# Patient Record
Sex: Male | Born: 1935 | Race: Black or African American | Hispanic: No | Smoking: Former smoker
Health system: Southern US, Community
[De-identification: ages and names within clinical notes are randomized; demographics above are authoritative.]

## PROBLEM LIST (undated history)

## (undated) DIAGNOSIS — K219 Gastro-esophageal reflux disease without esophagitis: Secondary | ICD-10-CM

## (undated) DIAGNOSIS — J189 Pneumonia, unspecified organism: Secondary | ICD-10-CM

## (undated) DIAGNOSIS — C801 Malignant (primary) neoplasm, unspecified: Secondary | ICD-10-CM

## (undated) DIAGNOSIS — E119 Type 2 diabetes mellitus without complications: Secondary | ICD-10-CM

## (undated) DIAGNOSIS — M1990 Unspecified osteoarthritis, unspecified site: Secondary | ICD-10-CM

## (undated) DIAGNOSIS — I8390 Asymptomatic varicose veins of unspecified lower extremity: Secondary | ICD-10-CM

## (undated) DIAGNOSIS — D649 Anemia, unspecified: Secondary | ICD-10-CM

## (undated) DIAGNOSIS — I10 Essential (primary) hypertension: Secondary | ICD-10-CM

## (undated) DIAGNOSIS — E785 Hyperlipidemia, unspecified: Secondary | ICD-10-CM

## (undated) DIAGNOSIS — N289 Disorder of kidney and ureter, unspecified: Secondary | ICD-10-CM

## (undated) DIAGNOSIS — J449 Chronic obstructive pulmonary disease, unspecified: Secondary | ICD-10-CM

## (undated) DIAGNOSIS — Z22322 Carrier or suspected carrier of Methicillin resistant Staphylococcus aureus: Secondary | ICD-10-CM

## (undated) DIAGNOSIS — I739 Peripheral vascular disease, unspecified: Secondary | ICD-10-CM

## (undated) HISTORY — PX: BACK SURGERY: SHX140

## (undated) HISTORY — PX: JOINT REPLACEMENT: SHX530

## (undated) HISTORY — PX: SPINE SURGERY: SHX786

## (undated) HISTORY — DX: Asymptomatic varicose veins of unspecified lower extremity: I83.90

## (undated) HISTORY — DX: Hyperlipidemia, unspecified: E78.5

## (undated) HISTORY — PX: HERNIA REPAIR: SHX51

## (undated) HISTORY — DX: Peripheral vascular disease, unspecified: I73.9

---

## 2004-02-23 ENCOUNTER — Other Ambulatory Visit: Payer: Self-pay

## 2004-08-15 ENCOUNTER — Ambulatory Visit: Payer: Self-pay | Admitting: Urology

## 2004-08-26 ENCOUNTER — Ambulatory Visit: Payer: Self-pay | Admitting: Radiation Oncology

## 2004-09-19 ENCOUNTER — Ambulatory Visit: Payer: Self-pay | Admitting: Radiation Oncology

## 2004-10-20 ENCOUNTER — Ambulatory Visit: Payer: Self-pay | Admitting: Radiation Oncology

## 2004-11-13 ENCOUNTER — Ambulatory Visit: Payer: Self-pay | Admitting: Urology

## 2004-11-20 ENCOUNTER — Ambulatory Visit: Payer: Self-pay | Admitting: Radiation Oncology

## 2004-12-18 ENCOUNTER — Ambulatory Visit: Payer: Self-pay | Admitting: Radiation Oncology

## 2005-01-18 ENCOUNTER — Ambulatory Visit: Payer: Self-pay | Admitting: Radiation Oncology

## 2005-04-17 ENCOUNTER — Ambulatory Visit: Payer: Self-pay | Admitting: Radiation Oncology

## 2005-04-19 ENCOUNTER — Ambulatory Visit: Payer: Self-pay | Admitting: Radiation Oncology

## 2005-10-16 ENCOUNTER — Ambulatory Visit: Payer: Self-pay | Admitting: Radiation Oncology

## 2005-10-20 ENCOUNTER — Ambulatory Visit: Payer: Self-pay | Admitting: Radiation Oncology

## 2005-11-24 ENCOUNTER — Ambulatory Visit: Payer: Self-pay | Admitting: Unknown Physician Specialty

## 2006-04-30 ENCOUNTER — Ambulatory Visit: Payer: Self-pay | Admitting: Radiation Oncology

## 2006-05-20 ENCOUNTER — Ambulatory Visit: Payer: Self-pay | Admitting: Radiation Oncology

## 2007-02-06 ENCOUNTER — Ambulatory Visit: Payer: Self-pay | Admitting: Physician Assistant

## 2007-08-18 ENCOUNTER — Ambulatory Visit: Payer: Self-pay | Admitting: Physician Assistant

## 2007-10-20 ENCOUNTER — Ambulatory Visit: Payer: Self-pay | Admitting: Unknown Physician Specialty

## 2007-10-20 ENCOUNTER — Other Ambulatory Visit: Payer: Self-pay

## 2007-10-21 DIAGNOSIS — Z22322 Carrier or suspected carrier of Methicillin resistant Staphylococcus aureus: Secondary | ICD-10-CM

## 2007-10-21 HISTORY — DX: Carrier or suspected carrier of methicillin resistant Staphylococcus aureus: Z22.322

## 2007-11-29 ENCOUNTER — Inpatient Hospital Stay: Payer: Self-pay | Admitting: Unknown Physician Specialty

## 2007-12-02 ENCOUNTER — Encounter: Payer: Self-pay | Admitting: Internal Medicine

## 2007-12-10 ENCOUNTER — Inpatient Hospital Stay: Payer: Self-pay | Admitting: Specialist

## 2007-12-19 ENCOUNTER — Encounter: Payer: Self-pay | Admitting: Internal Medicine

## 2007-12-27 ENCOUNTER — Emergency Department: Payer: Self-pay | Admitting: Emergency Medicine

## 2007-12-27 ENCOUNTER — Other Ambulatory Visit: Payer: Self-pay

## 2008-01-18 ENCOUNTER — Ambulatory Visit: Payer: Self-pay | Admitting: Unknown Physician Specialty

## 2008-02-17 ENCOUNTER — Encounter: Payer: Self-pay | Admitting: Unknown Physician Specialty

## 2008-02-18 ENCOUNTER — Encounter: Payer: Self-pay | Admitting: Unknown Physician Specialty

## 2008-02-28 ENCOUNTER — Encounter: Payer: Self-pay | Admitting: Unknown Physician Specialty

## 2008-03-20 ENCOUNTER — Encounter: Payer: Self-pay | Admitting: Unknown Physician Specialty

## 2008-03-21 ENCOUNTER — Ambulatory Visit: Payer: Self-pay | Admitting: Specialist

## 2008-04-19 ENCOUNTER — Encounter: Payer: Self-pay | Admitting: Unknown Physician Specialty

## 2008-05-20 ENCOUNTER — Encounter: Payer: Self-pay | Admitting: Unknown Physician Specialty

## 2008-06-11 ENCOUNTER — Inpatient Hospital Stay: Payer: Self-pay | Admitting: Internal Medicine

## 2008-06-11 ENCOUNTER — Other Ambulatory Visit: Payer: Self-pay

## 2008-06-19 ENCOUNTER — Encounter: Payer: Self-pay | Admitting: Internal Medicine

## 2008-06-20 ENCOUNTER — Encounter: Payer: Self-pay | Admitting: Internal Medicine

## 2008-07-06 ENCOUNTER — Ambulatory Visit: Payer: Self-pay | Admitting: Unknown Physician Specialty

## 2008-07-07 ENCOUNTER — Ambulatory Visit: Payer: Self-pay | Admitting: Unknown Physician Specialty

## 2008-08-02 ENCOUNTER — Ambulatory Visit: Payer: Self-pay | Admitting: Unknown Physician Specialty

## 2009-12-24 IMAGING — CR DG CHEST 2V
1 series · 2 of 2 positions shown · non-contrast
Comparison: none

REASON FOR EXAM: DM, HTN
COMMENTS:

PROCEDURE:     DXR - DXR CHEST PA (OR AP) AND LATERAL  - October 20, 2007  [DATE]
RESULT:     Comparison: No available comparison exam.

[Series 1: view not recorded · 0.17mm/px · 2 of 2 slices shown]
[im 1/2]
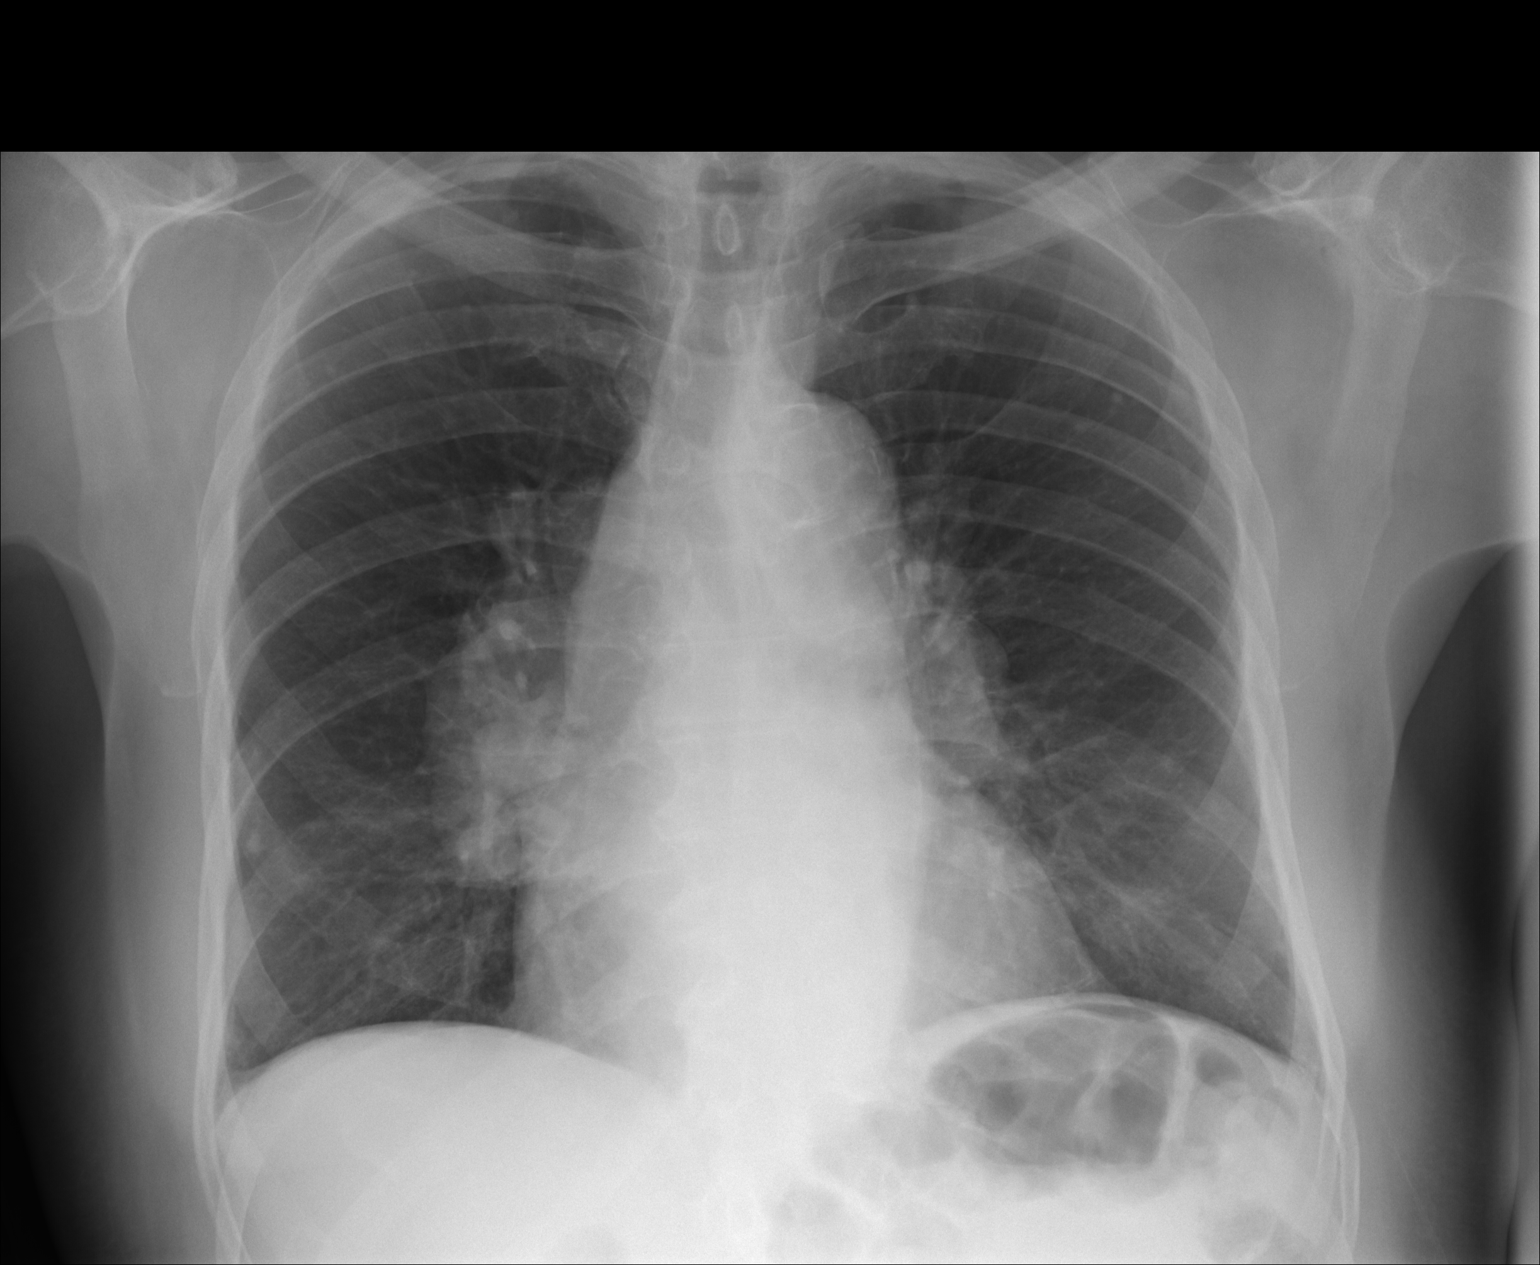
[im 2/2]
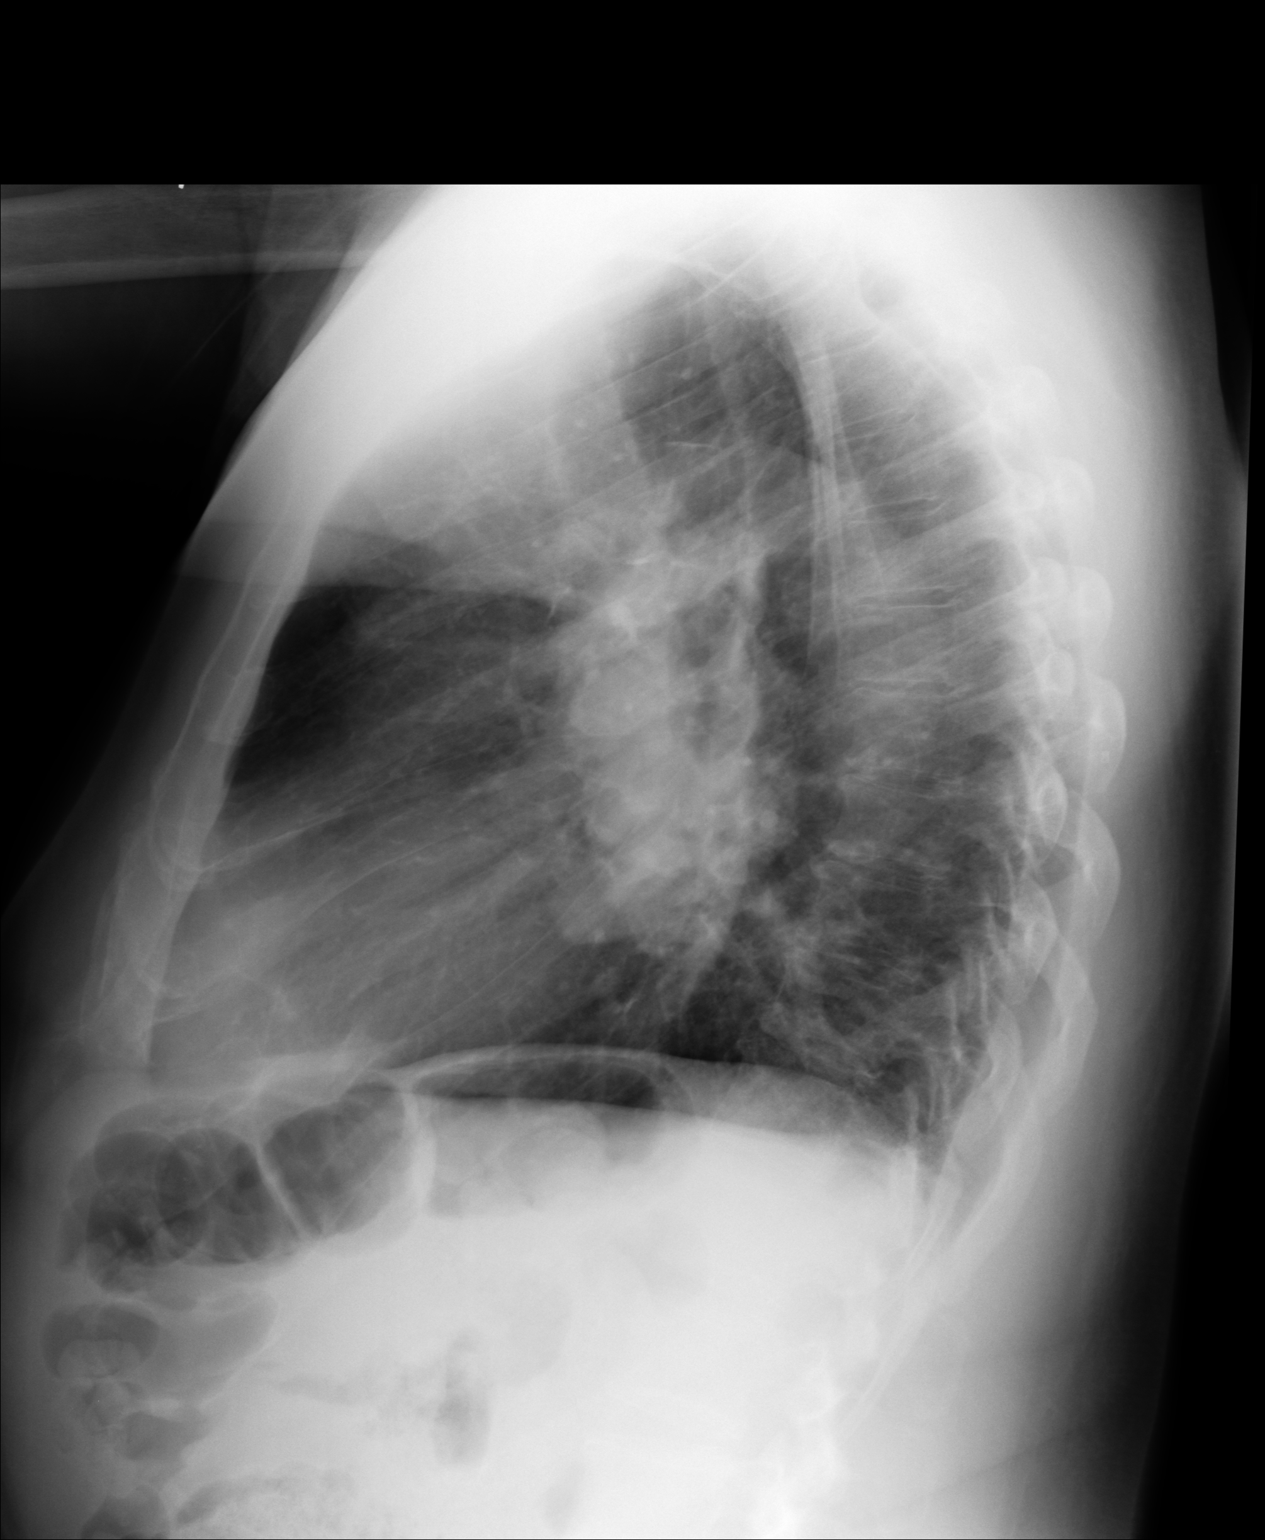

[2 of 2 positions shown; findings below may reference images not displayed]

FINDINGS: The bilateral hila are enlarged. This is likely due to lymphadenopathy.
There is no significant pulmonary consolidation, pulmonary edema, pleural
effusion, nor pneumothorax. The heart is not enlarged. Several calcified
nodules are seen involving both lungs, likely due to old granulomatous
disease.
IMPRESSION: 1. Marked, lobular enlargement of bilateral hila is likely due to
lymphadenopathy. Correlate with patient's clinical history. This can be
further evaluated with chest CT as clinically indicated.

## 2010-12-10 ENCOUNTER — Ambulatory Visit: Payer: Self-pay | Admitting: Unknown Physician Specialty

## 2011-04-17 ENCOUNTER — Ambulatory Visit: Payer: Self-pay | Admitting: Unknown Physician Specialty

## 2013-01-21 ENCOUNTER — Ambulatory Visit: Payer: Self-pay | Admitting: Internal Medicine

## 2013-03-07 ENCOUNTER — Ambulatory Visit: Payer: Self-pay | Admitting: Unknown Physician Specialty

## 2013-03-08 LAB — PATHOLOGY REPORT

## 2014-09-27 DIAGNOSIS — E118 Type 2 diabetes mellitus with unspecified complications: Secondary | ICD-10-CM | POA: Insufficient documentation

## 2014-11-06 DIAGNOSIS — D631 Anemia in chronic kidney disease: Secondary | ICD-10-CM | POA: Insufficient documentation

## 2014-11-06 DIAGNOSIS — E782 Mixed hyperlipidemia: Secondary | ICD-10-CM | POA: Insufficient documentation

## 2014-11-06 DIAGNOSIS — N189 Chronic kidney disease, unspecified: Secondary | ICD-10-CM | POA: Insufficient documentation

## 2015-03-28 IMAGING — US US EXTREM LOW VENOUS*R*
1 series · 14 of 24 positions shown · non-contrast
Comparison: none

REASON FOR EXAM: Call [phone] pager dr Makabe edema pain eval for DVT
COMMENTS:

[Series 1: us extrem low venous*right* · 0.11mm/px · 14 of 36 slices shown]
[im 1/36]
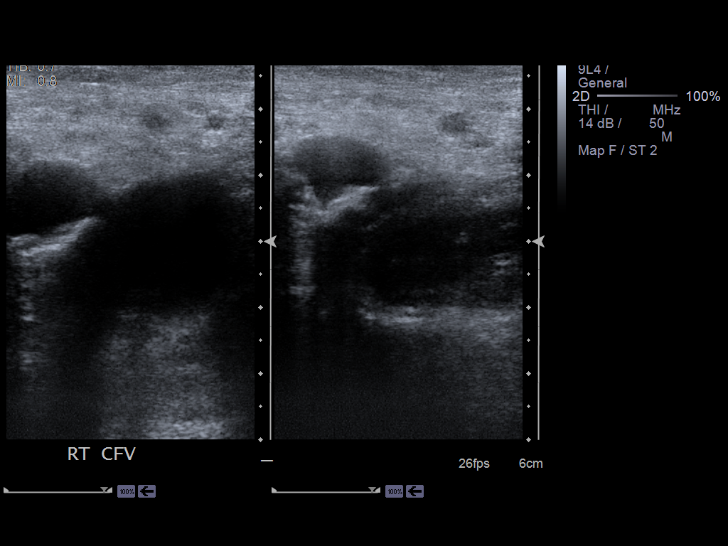
[im 4/36]
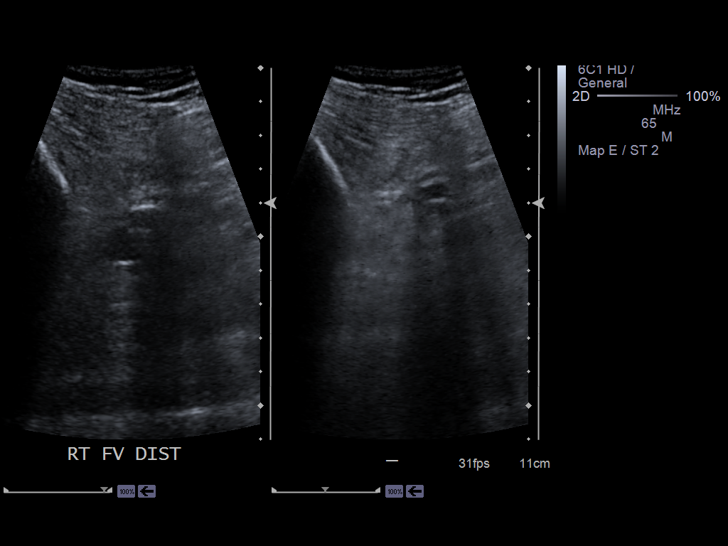
[im 7/36]
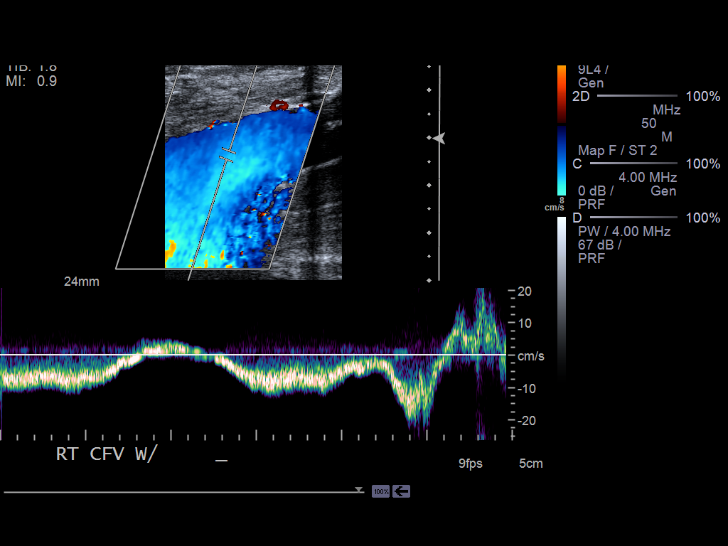
[im 10/36]
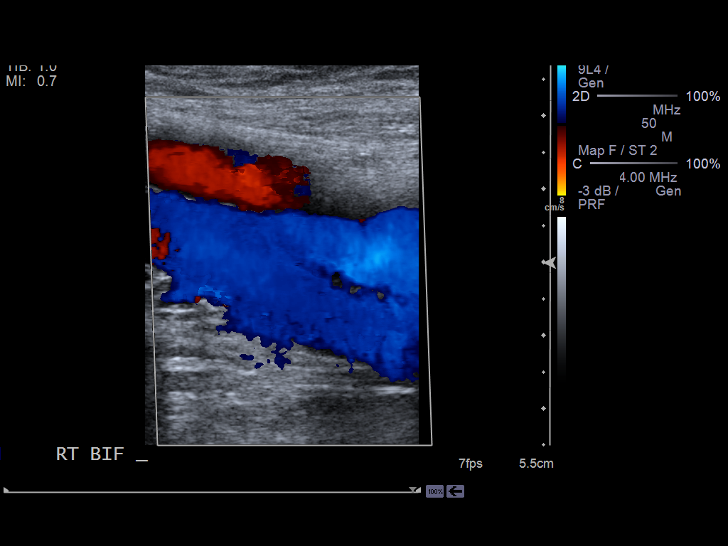
[im 11/36]
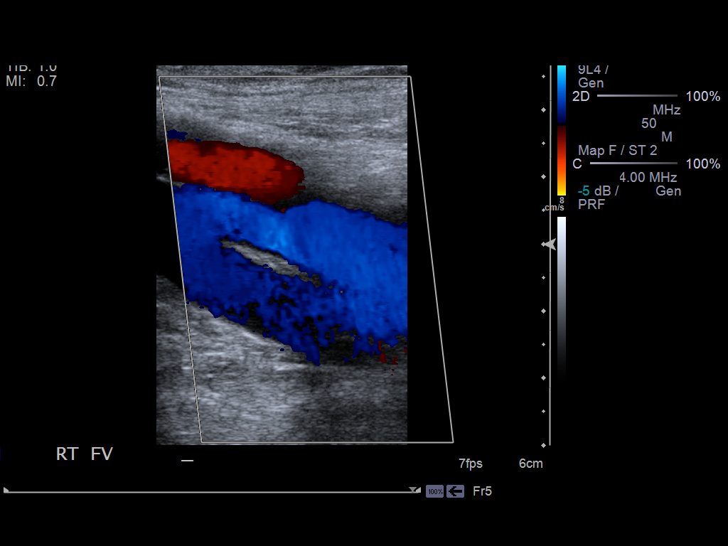
[im 14/36]
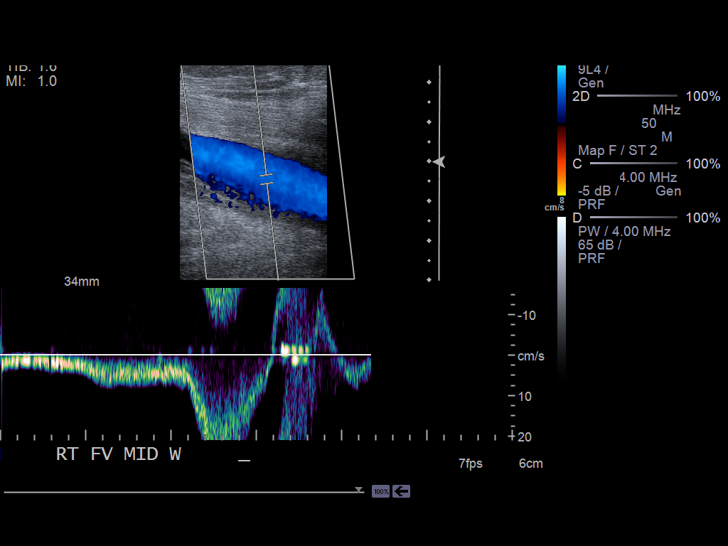
[im 17/36]
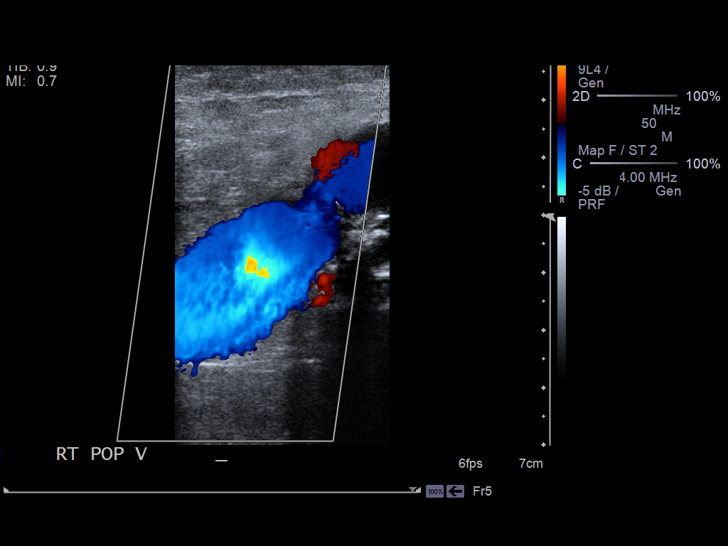
[im 19/36]
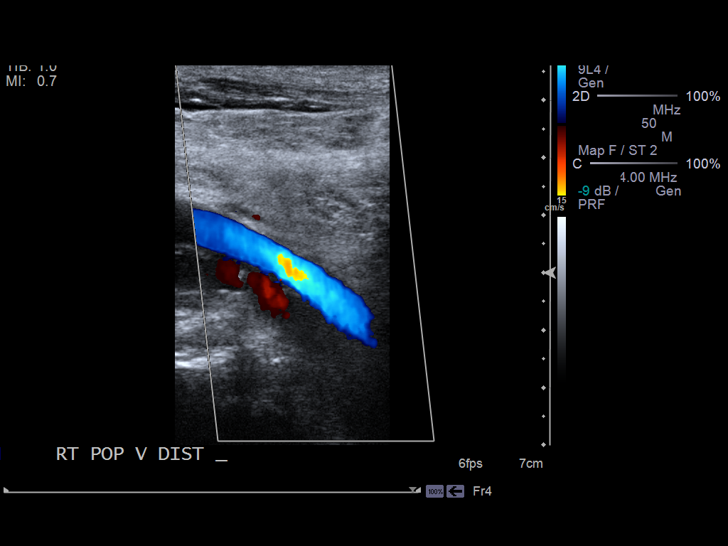
[im 22/36]
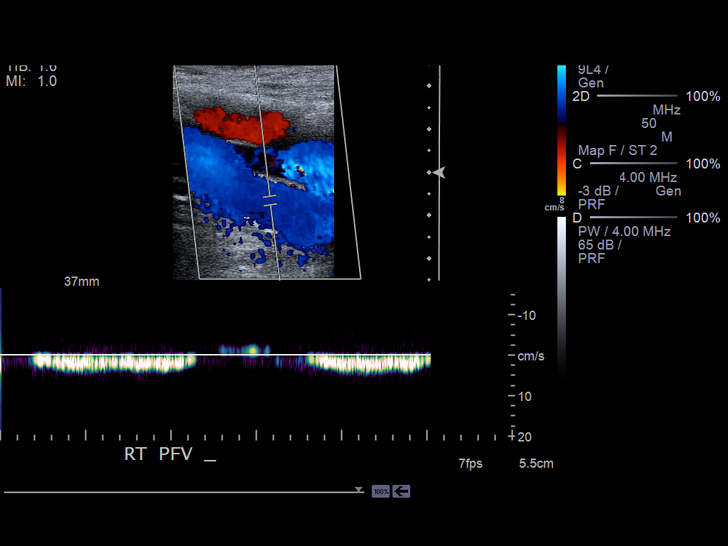
[im 25/36]
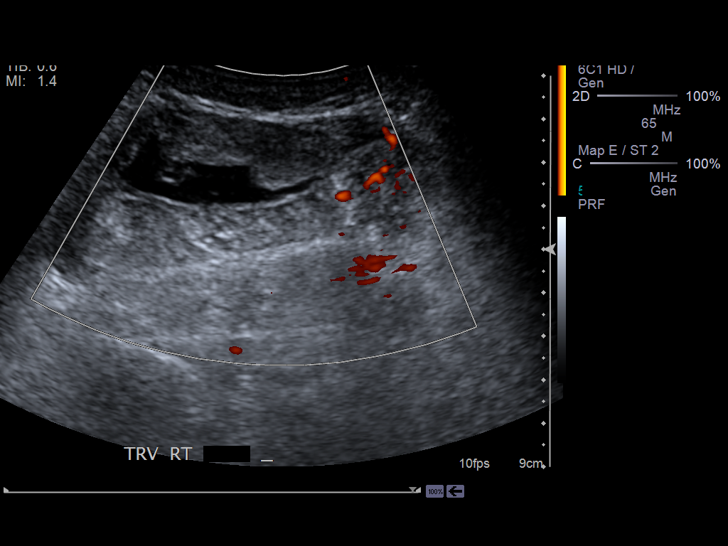
[im 28/36]
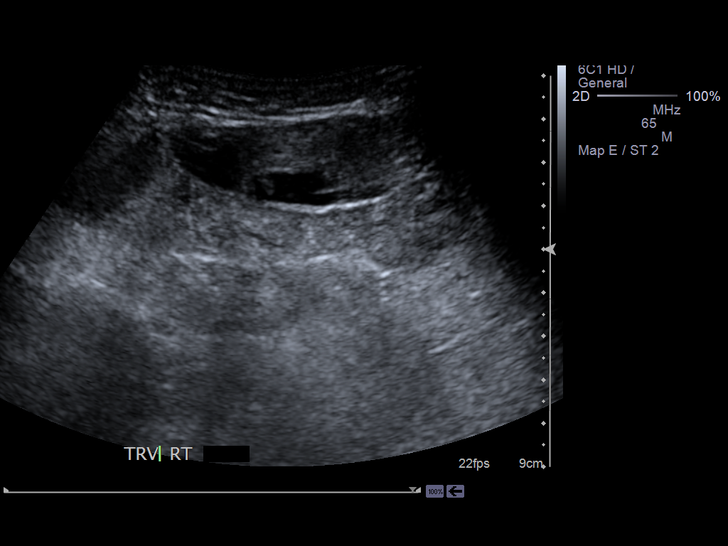
[im 29/36]
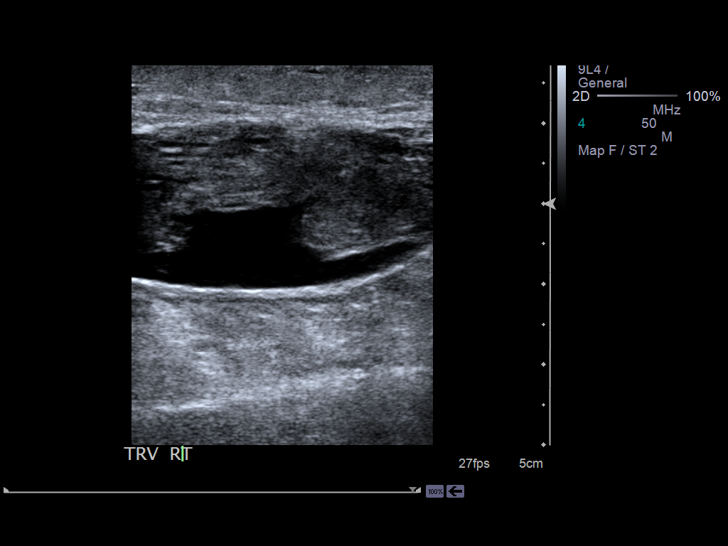
[im 32/36]
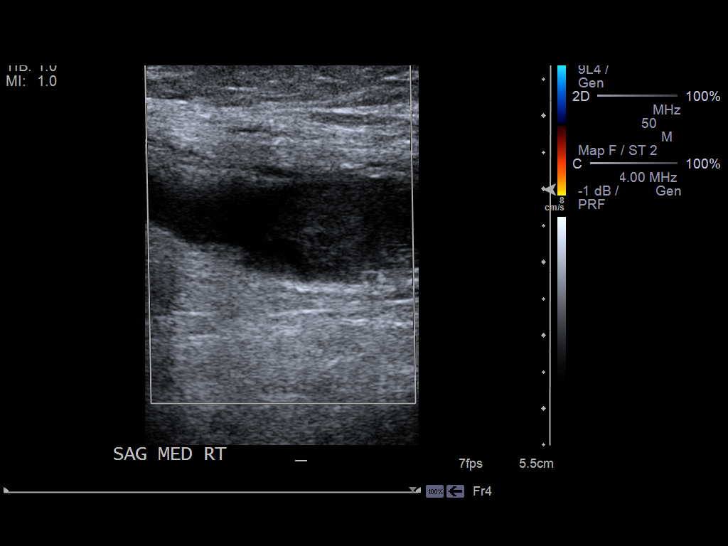
[im 36/36]
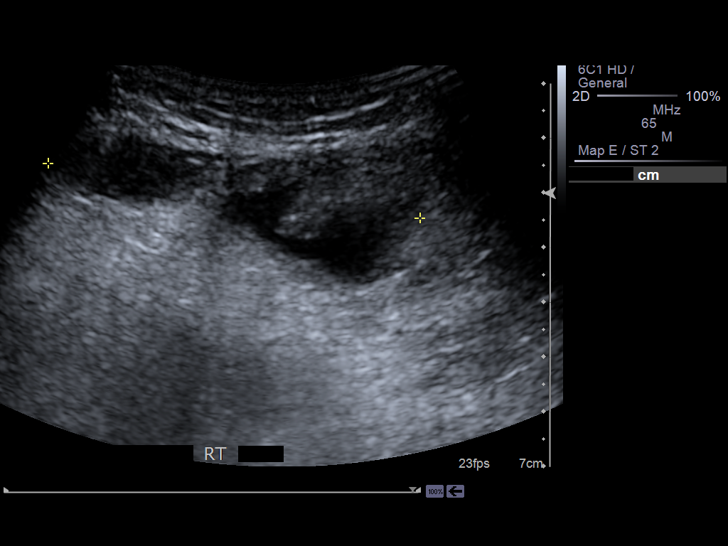

[14 of 24 positions shown; findings below may reference images not displayed]

PROCEDURE:     US  - US DOPPLER LOW EXTR RIGHT  - January 21, 2013  [DATE]

RESULT:     Doppler interrogation of the deep venous system of the right leg
is performed from the common femoral vein through the popliteal vein
demonstrating complete compressibility on grayscale images. Color Doppler
and spectral Doppler appearance shows normal venous waveforms and
directional flow. There is a complex fluid collection medial to the knee
extending into the calf measuring 3.13 x 6.7 x 9.32 cm. Correlate clinically.
IMPRESSION: 1. No evidence of right lower extremity deep vein thrombosis. Complex fluid
collection seen in the medial right knee and calf region as described.

[REDACTED]

## 2015-05-06 ENCOUNTER — Observation Stay
Admission: EM | Admit: 2015-05-06 | Discharge: 2015-05-08 | Disposition: A | Discharge status: Home / Self Care | Payer: Medicare Other | Attending: Internal Medicine | Admitting: Internal Medicine

## 2015-05-06 ENCOUNTER — Encounter: Payer: Self-pay | Admitting: *Deleted

## 2015-05-06 ENCOUNTER — Emergency Department: Payer: Medicare Other

## 2015-05-06 DIAGNOSIS — E785 Hyperlipidemia, unspecified: Secondary | ICD-10-CM | POA: Diagnosis not present

## 2015-05-06 DIAGNOSIS — Z833 Family history of diabetes mellitus: Secondary | ICD-10-CM | POA: Diagnosis not present

## 2015-05-06 DIAGNOSIS — Z87898 Personal history of other specified conditions: Secondary | ICD-10-CM | POA: Insufficient documentation

## 2015-05-06 DIAGNOSIS — Z888 Allergy status to other drugs, medicaments and biological substances status: Secondary | ICD-10-CM | POA: Insufficient documentation

## 2015-05-06 DIAGNOSIS — E11649 Type 2 diabetes mellitus with hypoglycemia without coma: Secondary | ICD-10-CM | POA: Diagnosis present

## 2015-05-06 DIAGNOSIS — K219 Gastro-esophageal reflux disease without esophagitis: Secondary | ICD-10-CM | POA: Diagnosis not present

## 2015-05-06 DIAGNOSIS — Z8546 Personal history of malignant neoplasm of prostate: Secondary | ICD-10-CM | POA: Diagnosis not present

## 2015-05-06 DIAGNOSIS — Z794 Long term (current) use of insulin: Secondary | ICD-10-CM | POA: Diagnosis not present

## 2015-05-06 DIAGNOSIS — E162 Hypoglycemia, unspecified: Secondary | ICD-10-CM

## 2015-05-06 DIAGNOSIS — T383X5S Adverse effect of insulin and oral hypoglycemic [antidiabetic] drugs, sequela: Principal | ICD-10-CM | POA: Insufficient documentation

## 2015-05-06 DIAGNOSIS — Z7982 Long term (current) use of aspirin: Secondary | ICD-10-CM | POA: Diagnosis not present

## 2015-05-06 DIAGNOSIS — I129 Hypertensive chronic kidney disease with stage 1 through stage 4 chronic kidney disease, or unspecified chronic kidney disease: Secondary | ICD-10-CM | POA: Insufficient documentation

## 2015-05-06 DIAGNOSIS — R4182 Altered mental status, unspecified: Secondary | ICD-10-CM | POA: Diagnosis not present

## 2015-05-06 DIAGNOSIS — E09649 Drug or chemical induced diabetes mellitus with hypoglycemia without coma: Secondary | ICD-10-CM | POA: Insufficient documentation

## 2015-05-06 DIAGNOSIS — Z9660 Presence of unspecified orthopedic joint implant: Secondary | ICD-10-CM | POA: Diagnosis not present

## 2015-05-06 DIAGNOSIS — N183 Chronic kidney disease, stage 3 (moderate): Secondary | ICD-10-CM | POA: Insufficient documentation

## 2015-05-06 HISTORY — DX: Type 2 diabetes mellitus without complications: E11.9

## 2015-05-06 HISTORY — DX: Disorder of kidney and ureter, unspecified: N28.9

## 2015-05-06 HISTORY — DX: Malignant (primary) neoplasm, unspecified: C80.1

## 2015-05-06 HISTORY — DX: Essential (primary) hypertension: I10

## 2015-05-06 LAB — CBC
HCT: 37.5 % — ABNORMAL LOW (ref 40.0–52.0)
Hemoglobin: 12.1 g/dL — ABNORMAL LOW (ref 13.0–18.0)
MCH: 29.4 pg (ref 26.0–34.0)
MCHC: 32.1 g/dL (ref 32.0–36.0)
MCV: 91.6 fL (ref 80.0–100.0)
Platelets: 223 10*3/uL (ref 150–440)
RBC: 4.1 MIL/uL — ABNORMAL LOW (ref 4.40–5.90)
RDW: 16.6 % — ABNORMAL HIGH (ref 11.5–14.5)
WBC: 7.4 10*3/uL (ref 3.8–10.6)

## 2015-05-06 LAB — COMPREHENSIVE METABOLIC PANEL
ALK PHOS: 66 U/L (ref 38–126)
ALT: 22 U/L (ref 17–63)
AST: 38 U/L (ref 15–41)
Albumin: 4.2 g/dL (ref 3.5–5.0)
Anion gap: 10 (ref 5–15)
BILIRUBIN TOTAL: 0.3 mg/dL (ref 0.3–1.2)
BUN: 29 mg/dL — AB (ref 6–20)
CO2: 28 mmol/L (ref 22–32)
Calcium: 8.8 mg/dL — ABNORMAL LOW (ref 8.9–10.3)
Chloride: 95 mmol/L — ABNORMAL LOW (ref 101–111)
Creatinine, Ser: 1.44 mg/dL — ABNORMAL HIGH (ref 0.61–1.24)
GFR calc Af Amer: 50 mL/min — ABNORMAL LOW (ref >60)
GFR calc non Af Amer: 43 mL/min — ABNORMAL LOW (ref >60)
Glucose, Bld: 78 mg/dL (ref 65–99)
Potassium: 3.3 mmol/L — ABNORMAL LOW (ref 3.5–5.1)
SODIUM: 133 mmol/L — AB (ref 135–145)
Total Protein: 8.2 g/dL — ABNORMAL HIGH (ref 6.5–8.1)

## 2015-05-06 LAB — GLUCOSE, CAPILLARY
GLUCOSE-CAPILLARY: 112 mg/dL — AB (ref 65–99)
GLUCOSE-CAPILLARY: 20 mg/dL — AB (ref 65–99)
GLUCOSE-CAPILLARY: 185 mg/dL — AB (ref 65–99)
Glucose-Capillary: 72 mg/dL (ref 65–99)

## 2015-05-06 LAB — URINALYSIS COMPLETE WITH MICROSCOPIC (ARMC ONLY)
Bacteria, UA: NONE SEEN
Bilirubin Urine: NEGATIVE
Glucose, UA: 150 mg/dL — AB
Hgb urine dipstick: NEGATIVE
KETONES UR: NEGATIVE mg/dL
Leukocytes, UA: NEGATIVE
NITRITE: NEGATIVE
PH: 6 (ref 5.0–8.0)
PROTEIN: 100 mg/dL — AB
Specific Gravity, Urine: 1.014 (ref 1.005–1.030)
Squamous Epithelial / LPF: NONE SEEN

## 2015-05-06 LAB — TROPONIN I: Troponin I: <0.03 ng/mL (ref <0.031)

## 2015-05-06 MED ORDER — ASPIRIN EC 81 MG PO TBEC
81.0000 mg | DELAYED_RELEASE_TABLET | Freq: Every day | ORAL | Status: DC
  Administered 2015-05-07 – 2015-05-08 (×2): 81 mg via ORAL
  Filled 2015-05-06 (×2): qty 1

## 2015-05-06 MED ORDER — CEFDINIR 300 MG PO CAPS
300.0000 mg | ORAL_CAPSULE | Freq: Two times a day (BID) | ORAL | Status: DC
  Administered 2015-05-07 (×2): 300 mg via ORAL
  Filled 2015-05-06 (×3): qty 1

## 2015-05-06 MED ORDER — HYDRALAZINE HCL 20 MG/ML IJ SOLN: 10.0000 mg | INTRAMUSCULAR | Status: DC | PRN

## 2015-05-06 MED ORDER — ONDANSETRON HCL 4 MG/2ML IJ SOLN: 4.0000 mg | Freq: Four times a day (QID) | INTRAMUSCULAR | Status: DC | PRN

## 2015-05-06 MED ORDER — ACETAMINOPHEN 325 MG PO TABS: 650.0000 mg | ORAL_TABLET | Freq: Four times a day (QID) | ORAL | Status: DC | PRN

## 2015-05-06 MED ORDER — MORPHINE SULFATE 2 MG/ML IJ SOLN: 2.0000 mg | INTRAMUSCULAR | Status: DC | PRN

## 2015-05-06 MED ORDER — OXYBUTYNIN CHLORIDE ER 5 MG PO TB24
5.0000 mg | ORAL_TABLET | Freq: Every day | ORAL | Status: DC
  Administered 2015-05-06 – 2015-05-07 (×2): 5 mg via ORAL
  Filled 2015-05-06 (×3): qty 1

## 2015-05-06 MED ORDER — ATORVASTATIN CALCIUM 20 MG PO TABS
40.0000 mg | ORAL_TABLET | Freq: Every day | ORAL | Status: DC
  Administered 2015-05-07: 18:00:00 40 mg via ORAL
  Filled 2015-05-06: qty 2

## 2015-05-06 MED ORDER — CEFDINIR 125 MG/5ML PO SUSR
300.0000 mg | Freq: Two times a day (BID) | ORAL | Status: DC
  Filled 2015-05-06 (×3): qty 15

## 2015-05-06 MED ORDER — ASPIRIN 81 MG PO TABS: 81.0000 mg | ORAL_TABLET | Freq: Every day | ORAL | Status: DC

## 2015-05-06 MED ORDER — ONDANSETRON HCL 4 MG PO TABS: 4.0000 mg | ORAL_TABLET | Freq: Four times a day (QID) | ORAL | Status: DC | PRN

## 2015-05-06 MED ORDER — CEFDITOREN PIVOXIL 200 MG PO TABS: 200.0000 mg | ORAL_TABLET | Freq: Two times a day (BID) | ORAL | Status: DC

## 2015-05-06 MED ORDER — PANTOPRAZOLE SODIUM 40 MG PO TBEC
40.0000 mg | DELAYED_RELEASE_TABLET | Freq: Every day | ORAL | Status: DC
  Administered 2015-05-07 – 2015-05-08 (×2): 40 mg via ORAL
  Filled 2015-05-06 (×2): qty 1

## 2015-05-06 MED ORDER — DOCUSATE SODIUM 100 MG PO CAPS
100.0000 mg | ORAL_CAPSULE | Freq: Two times a day (BID) | ORAL | Status: DC
  Administered 2015-05-06 – 2015-05-08 (×4): 100 mg via ORAL
  Filled 2015-05-06 (×4): qty 1

## 2015-05-06 MED ORDER — METOPROLOL SUCCINATE ER 25 MG PO TB24
50.0000 mg | ORAL_TABLET | Freq: Every day | ORAL | Status: DC
  Administered 2015-05-07 – 2015-05-08 (×2): 50 mg via ORAL
  Filled 2015-05-06 (×2): qty 2

## 2015-05-06 MED ORDER — ACETAMINOPHEN 650 MG RE SUPP: 650.0000 mg | Freq: Four times a day (QID) | RECTAL | Status: DC | PRN

## 2015-05-06 MED ORDER — HEPARIN SODIUM (PORCINE) 5000 UNIT/ML IJ SOLN
5000.0000 [IU] | Freq: Three times a day (TID) | INTRAMUSCULAR | Status: DC
  Administered 2015-05-06 – 2015-05-08 (×5): 5000 [IU] via SUBCUTANEOUS
  Filled 2015-05-06 (×5): qty 1

## 2015-05-06 NOTE — ED Notes (Signed)
Pt to ED from home via EMS due to daughter finding pt on floor in bathroom weak, cold and sweating. Per EMS BGL 26 when arrived to scene. EMS gave 1mg  glucagon in left deltoid. On arrival to ED BGL still 20. MD kinner at bedside, pt AAO, slightly confused. Pt given oj to drink via MD kinner.

## 2015-05-06 NOTE — ED Provider Notes (Signed)
[facility] Emergency Department Provider Note  ____________________________________________  Time seen: On arrival, via EMS  I have reviewed the triage vital signs and the nursing notes.   HISTORY  Chief Complaint Hypoglycemia    HPI Keith Davila is a 81 y.o. male who presents with altered mental status. Per EMS daughter found the patient on the floor in the bathroom weak and diaphoretic and confused. When EMS arrived they found blood sugar 26 and they gave 1 mg of glucagon in the left arm with some improvement in the glucose. Upon arrival to the emergency department patient answering questions appropriately.     Past Medical History  Diagnosis Date  . Hypertension   . Diabetes mellitus without complication   . Renal disorder   . Cancer     There are no active problems to display for this patient.   Past Surgical History  Procedure Laterality Date  . Back surgery    . Joint replacement      Current Outpatient Rx  Name  Route  Sig  Dispense  Refill  . aspirin 81 MG tablet   Oral   Take 81 mg by mouth daily.         . calcium-vitamin D (OSCAL WITH D) 500-200 MG-UNIT per tablet   Oral   Take 1 tablet by mouth.         . ceditoren (SPECTRACEF) 200 MG tablet   Oral   Take 200 mg by mouth 2 (two) times daily.         Marland Kitchen docusate sodium (COLACE) 100 MG capsule   Oral   Take 100 mg by mouth 2 (two) times daily.         Marland Kitchen glipiZIDE (GLUCOTROL) 10 MG tablet   Oral   Take 10 mg by mouth 2 (two) times daily before a meal.         . hydrochlorothiazide (HYDRODIURIL) 25 MG tablet   Oral   Take 25 mg by mouth daily.         . insulin NPH-regular Human (NOVOLIN 70/30) (70-30) 100 UNIT/ML injection   Subcutaneous   Inject into the skin 2 (two) times daily with a meal.         . metoprolol succinate (TOPROL-XL) 50 MG 24 hr tablet   Oral   Take 50 mg by mouth daily. Take with or immediately following a meal.         .  omeprazole (PRILOSEC) 20 MG capsule   Oral   Take 20 mg by mouth 2 (two) times daily before a meal.         . simvastatin (ZOCOR) 80 MG tablet   Oral   Take 80 mg by mouth daily.         . sitaGLIPtin (JANUVIA) 50 MG tablet   Oral   Take 50 mg by mouth daily.         Marland Kitchen tolterodine (DETROL) 1 MG tablet   Oral   Take 1 mg by mouth daily.           Allergies Prevacid  History reviewed. No pertinent family history.  Social History History  Substance Use Topics  . Smoking status: Never Smoker   . Smokeless tobacco: Not on file  . Alcohol Use: No    Review of Systems  Constitutional: Negative for fever. Eyes: Negative for visual changes. ENT: Negative for sore throat Cardiovascular: Negative for chest pain. Respiratory: Negative for shortness of breath. Gastrointestinal: Negative for  abdominal pain, vomiting and diarrhea. Genitourinary: Negative for dysuria. Musculoskeletal: Negative for back pain. Skin: Negative for rash. Neurological: Negative for headaches or focal weakness Psychiatric: No anxiety  10-point ROS otherwise negative.  ____________________________________________   PHYSICAL EXAM:  VITAL SIGNS: BP 178/98 mmHg  Pulse 53  Resp 16  Ht 6\' 2"  (1.88 m)  Wt 197 lb (89.359 kg)  BMI 25.28 kg/m2  SpO2 100%  ED Triage Vitals  Enc Vitals Group     BP --      Pulse --      Resp --      Temp --      Temp src --      SpO2 --      Weight --      Height --      Head Cir --      Peak Flow --      Pain Score --      Pain Loc --      Pain Edu? --      Excl. in [place]? --      Constitutional: Alert and oriented. Well appearing and in no distress. Eyes: Conjunctivae are normal.  ENT   Head: Normocephalic and atraumatic.   Mouth/Throat: Mucous membranes are moist. Cardiovascular: Normal rate, regular rhythm. Normal and symmetric distal pulses are present in all extremities. No murmurs, rubs, or gallops. Respiratory: Normal respiratory  effort without tachypnea nor retractions. Breath sounds are clear and equal bilaterally.  Gastrointestinal: Soft and non-tender in all quadrants. No distention. There is no CVA tenderness. Genitourinary: deferred Musculoskeletal: Nontender with normal range of motion in all extremities. No lower extremity tenderness nor edema. Neurologic:  Normal speech and language. No gross focal neurologic deficits are appreciated. Skin:  Skin is warm, dry and intact. No rash noted. Psychiatric: Mood and affect are normal. Patient exhibits appropriate insight and judgment.  ____________________________________________    LABS (pertinent positives/negatives)  Labs Reviewed  COMPREHENSIVE METABOLIC PANEL - Abnormal; Notable for the following:    Sodium 133 (*)    Potassium 3.3 (*)    Chloride 95 (*)    BUN 29 (*)    Creatinine, Ser 1.44 (*)    Calcium 8.8 (*)    Total Protein 8.2 (*)    GFR calc non Af Amer 43 (*)    GFR calc Af Amer 50 (*)    All other components within normal limits  CBC - Abnormal; Notable for the following:    RBC 4.10 (*)    Hemoglobin 12.1 (*)    HCT 37.5 (*)    RDW 16.6 (*)    All other components within normal limits  GLUCOSE, CAPILLARY - Abnormal; Notable for the following:    Glucose-Capillary 20 (*)    All other components within normal limits  GLUCOSE, CAPILLARY - Abnormal; Notable for the following:    Glucose-Capillary 112 (*)    All other components within normal limits  TROPONIN I  GLUCOSE, CAPILLARY  URINALYSIS COMPLETEWITH MICROSCOPIC ([facility] ONLY)    ____________________________________________   EKG None  ____________________________________________    RADIOLOGY I have personally reviewed any xrays that were ordered on this patient: No acute distress  ____________________________________________   PROCEDURES  Procedure(s) performed: none  Critical Care performed: yes CRITICAL CARE Performed by: Lavonia Drafts   Total critical care  time: 30  Critical care time was exclusive of separately billable procedures and treating other patients.  Critical care was necessary to treat or prevent imminent or life-threatening deterioration.  Critical care was time spent personally by me on the following activities: development of treatment plan with patient and/or surrogate as well as nursing, discussions with consultants, evaluation of patient's response to treatment, examination of patient, obtaining history from patient or surrogate, ordering and performing treatments and interventions, ordering and review of laboratory studies, ordering and review of radiographic studies, pulse oximetry and re-evaluation of patient's condition.   ____________________________________________   INITIAL IMPRESSION / ASSESSMENT AND PLAN / ED COURSE  Pertinent labs & imaging results that were available during my care of the patient were reviewed by me and considered in my medical decision making (see chart for details).  Accu-Chek in the emergency department shows glucose of 20. Patient answering questions so I had him drink 2 orange juice cups which improved his glucose to 72. Per daughter patient's glucose is been low recently and his doctor decreased his insulin. There is some question about whether he has been using his insulin appropriately.  ____________________________________________   FINAL CLINICAL IMPRESSION(S) / ED DIAGNOSES  Final diagnoses:  Hypoglycemia     Lavonia Drafts, MD 05/06/15 2101

## 2015-05-06 NOTE — H&P (Signed)
[place] [facility] [place] NAME: Keith Davila    MR#:  [id number]  DATE OF BIRTH:  [birth year]   DATE OF ADMISSION:  05/06/2015  PRIMARY CARE PHYSICIAN: Faye Ramsay, FNP   REQUESTING/REFERRING PHYSICIAN: Corky Downs  CHIEF COMPLAINT:   Chief Complaint  Patient presents with  . Hypoglycemia    HISTORY OF PRESENT ILLNESS:  Trevan Messman  is a 81 y.o. male with a known history of type 2 diabetes insulin requiring who is presenting with altered mental status. History aided by family members present at bedside. States that he was in his usual state of health however his daughter found him on the floor today. She called for him and he did not respond so she went to confound him lying on the floor, he was nonverbal at the time but was responsive, markedly confused. With that they called EMS. Upon their arrival noted to have blood glucose of 26, received 1 mg of IM glucagon and brought to the hospital for further workup and evaluation. Of note he states it is quite possible he takes it accurate doses medications as he uses a 10 mL syringe for his insulin, he also attests to having poor by mouth intake these past few days he's Artie been evaluated by nutritionist as well as diabetic education in the past and they have further meetings with them in the near future as well  PAST MEDICAL HISTORY:   Past Medical History  Diagnosis Date  . Hypertension   . Diabetes mellitus without complication   . Renal disorder   . Cancer     PAST SURGICAL HISTORY:   Past Surgical History  Procedure Laterality Date  . Back surgery    . Joint replacement      SOCIAL HISTORY:   History  Substance Use Topics  . Smoking status: Never Smoker   . Smokeless tobacco: Not on file  . Alcohol Use: No    FAMILY HISTORY:   Family History  Problem Relation Age of Onset  . Diabetes Other     DRUG ALLERGIES:   Allergies  Allergen Reactions  .  Prevacid [Lansoprazole]     REVIEW OF SYSTEMS:  REVIEW OF SYSTEMS:  CONSTITUTIONAL: Denies fevers, chills, fatigue, weakness.  EYES: Denies blurred vision, double vision, or eye pain.  EARS, NOSE, THROAT: Denies tinnitus, ear pain, hearing loss.  RESPIRATORY: denies cough, shortness of breath, wheezing  CARDIOVASCULAR: Denies chest pain, palpitations, edema.  GASTROINTESTINAL: Denies nausea, vomiting, diarrhea, abdominal pain.  GENITOURINARY: Denies dysuria, hematuria.  ENDOCRINE: Denies nocturia or thyroid problems. HEMATOLOGIC AND LYMPHATIC: Denies easy bruising or bleeding.  SKIN: Denies rash or lesions.  MUSCULOSKELETAL: Denies pain in neck, back, shoulder, knees, hips, or further arthritic symptoms.  NEUROLOGIC: Denies paralysis, paresthesias.  PSYCHIATRIC: Denies anxiety or depressive symptoms. Otherwise full review of systems performed by me is negative.   MEDICATIONS AT HOME:   Prior to Admission medications   Medication Sig Start Date End Date Taking? Authorizing Provider  aspirin 81 MG tablet Take 81 mg by mouth daily.   Yes Historical Provider, MD  calcium-vitamin D (OSCAL WITH D) 500-200 MG-UNIT per tablet Take 1 tablet by mouth.   Yes Historical Provider, MD  ceditoren (SPECTRACEF) 200 MG tablet Take 200 mg by mouth 2 (two) times daily.   Yes Historical Provider, MD  docusate sodium (COLACE) 100 MG capsule Take 100 mg by mouth 2 (two) times daily.   Yes Historical Provider, MD  glipiZIDE (  GLUCOTROL) 10 MG tablet Take 10 mg by mouth 2 (two) times daily before a meal.   Yes Historical Provider, MD  hydrochlorothiazide (HYDRODIURIL) 25 MG tablet Take 25 mg by mouth daily.   Yes Historical Provider, MD  insulin NPH-regular Human (NOVOLIN 70/30) (70-30) 100 UNIT/ML injection Inject into the skin 2 (two) times daily with a meal.   Yes Historical Provider, MD  metoprolol succinate (TOPROL-XL) 50 MG 24 hr tablet Take 50 mg by mouth daily. Take with or immediately following a  meal.   Yes Historical Provider, MD  omeprazole (PRILOSEC) 20 MG capsule Take 20 mg by mouth 2 (two) times daily before a meal.   Yes Historical Provider, MD  simvastatin (ZOCOR) 80 MG tablet Take 80 mg by mouth daily.   Yes Historical Provider, MD  sitaGLIPtin (JANUVIA) 50 MG tablet Take 50 mg by mouth daily.   Yes Historical Provider, MD  tolterodine (DETROL) 1 MG tablet Take 1 mg by mouth daily.   Yes Historical Provider, MD      VITAL SIGNS:  Blood pressure 178/98, pulse 53, resp. rate 16, height 6\' 2"  (1.88 m), weight 197 lb (89.359 kg), SpO2 100 %.  PHYSICAL EXAMINATION:  VITAL SIGNS: Filed Vitals:   05/06/15 1859  BP: 178/98  Pulse: 53  Resp: 16   GENERAL:81 y.o.male currently in no acute distress.  HEAD: Normocephalic, atraumatic.  EYES: Pupils equal, round, reactive to light. Extraocular muscles intact. No scleral icterus.  MOUTH: Moist mucosal membrane. Dentition intact. No abscess noted.  EAR, NOSE, THROAT: Clear without exudates. No external lesions.  NECK: Supple. No thyromegaly. No nodules. No JVD.  PULMONARY: Clear to ascultation, without wheeze rails or rhonci. No use of accessory muscles, Good respiratory effort. good air entry bilaterally CHEST: Nontender to palpation.  CARDIOVASCULAR: S1 and S2. Regular rate and rhythm. No murmurs, rubs, or gallops. No edema. Pedal pulses 2+ bilaterally.  GASTROINTESTINAL: Soft, nontender, nondistended. No masses. Positive bowel sounds. No hepatosplenomegaly.  MUSCULOSKELETAL: No swelling, clubbing, or edema. Range of motion full in all extremities.  NEUROLOGIC: Cranial nerves II through XII are intact. No gross focal neurological deficits. Sensation intact. Reflexes intact.  SKIN: No ulceration, lesions, rashes, or cyanosis. Skin warm and dry. Turgor intact.  PSYCHIATRIC: Mood, affect within normal limits. The patient is awake, alert and oriented x 3. Insight, judgment intact.    LABORATORY PANEL:   CBC  Recent Labs Lab  05/06/15 1908  WBC 7.4  HGB 12.1*  HCT 37.5*  PLT 223   ------------------------------------------------------------------------------------------------------------------  Chemistries   Recent Labs Lab 05/06/15 1908  NA 133*  K 3.3*  CL 95*  CO2 28  GLUCOSE 78  BUN 29*  CREATININE 1.44*  CALCIUM 8.8*  AST 38  ALT 22  ALKPHOS 66  BILITOT 0.3   ------------------------------------------------------------------------------------------------------------------  Cardiac Enzymes  Recent Labs Lab 05/06/15 1908  TROPONINI <0.03   ------------------------------------------------------------------------------------------------------------------  RADIOLOGY:  Dg Chest Portable 1 View  05/06/2015   CLINICAL DATA:  Altered mental status  EXAM: PORTABLE CHEST - 1 VIEW  COMPARISON:  None.  FINDINGS: Cardiac shadow is enlarged. Aortic calcifications are noted. The lungs are well aerated bilaterally with minimal bibasilar atelectasis. No sizable effusion is seen. No acute bony abnormality is noted. Calcified granulomas are noted bilaterally.  IMPRESSION: Bibasilar atelectasis.  Aortic atherosclerotic disease.   Electronically Signed   By: Inez Catalina M.D.   On: 05/06/2015 19:25    EKG:   Orders placed or performed during the hospital encounter of  05/06/15  . ED EKG  . ED EKG  . EKG 12-Lead  . EKG 12-Lead    IMPRESSION AND PLAN:   81 year old African gentleman history of type 2 diabetes treated with insulin, essential hypertension who presented with altered mental status found to be hypoglycemic  1. Hypoglycemia related to type 2 diabetes: Glucose has improved after receiving glucagon as well as some oral intake, continue with Accu-Cheks every 2 hours 2 if glucose remains greater than 100 can space out to every 4 hours, hold diuretic agents 2. Essential hypertension: Hold diuretics, continue metoprolol 3. Hyperlipidemia unspecified: Statin therapy 4. GERD without esophagitis:  PPI therapy 5. Venous thromboembolism prophylactic: Heparin subcutaneous    All the records are reviewed and case discussed with ED provider. Management plans discussed with the patient, family and they are in agreement.  CODE STATUS: Full  TOTAL TIME TAKING CARE OF THIS PATIENT: 35 minutes.    Hower,  Karenann Cai.D on 05/06/2015 at 9:47 PM  Between 7am to 6pm - Pager - [phone]  After 6pm: House Pager: - [phone]  [facility]  Office  [phone]  CC: Primary care physician; Faye Ramsay, FNP

## 2015-05-06 NOTE — Plan of Care (Signed)
Problem: Discharge Progression Outcomes Goal: Discharge plan in place and appropriate Individualization Pt lives at home alone-found unresponsive with BS of 26 Moderate fall risk-offer assistance on safety rounds Hx of HTN,  and prostate cancer controlled by home medications

## 2015-05-07 LAB — BASIC METABOLIC PANEL
Anion gap: 7 (ref 5–15)
BUN: 29 mg/dL — AB (ref 6–20)
CO2: 27 mmol/L (ref 22–32)
Calcium: 8.5 mg/dL — ABNORMAL LOW (ref 8.9–10.3)
Chloride: 102 mmol/L (ref 101–111)
Creatinine, Ser: 1.35 mg/dL — ABNORMAL HIGH (ref 0.61–1.24)
GFR, EST AFRICAN AMERICAN: 54 mL/min — AB (ref >60)
GFR, EST NON AFRICAN AMERICAN: 47 mL/min — AB (ref >60)
Glucose, Bld: 235 mg/dL — ABNORMAL HIGH (ref 65–99)
Potassium: 4 mmol/L (ref 3.5–5.1)
SODIUM: 136 mmol/L (ref 135–145)

## 2015-05-07 LAB — GLUCOSE, CAPILLARY
GLUCOSE-CAPILLARY: 102 mg/dL — AB (ref 65–99)
GLUCOSE-CAPILLARY: 229 mg/dL — AB (ref 65–99)
Glucose-Capillary: 262 mg/dL — ABNORMAL HIGH (ref 65–99)
Glucose-Capillary: 179 mg/dL — ABNORMAL HIGH (ref 65–99)
Glucose-Capillary: 396 mg/dL — ABNORMAL HIGH (ref 65–99)

## 2015-05-07 MED ORDER — INSULIN ASPART 100 UNIT/ML ~~LOC~~ SOLN
0.0000 [IU] | Freq: Three times a day (TID) | SUBCUTANEOUS | Status: DC
  Administered 2015-05-07: 9 [IU] via SUBCUTANEOUS
  Administered 2015-05-08: 09:00:00 2 [IU] via SUBCUTANEOUS
  Administered 2015-05-08: 7 [IU] via SUBCUTANEOUS
  Filled 2015-05-07: qty 7
  Filled 2015-05-07: qty 9
  Filled 2015-05-07: qty 2

## 2015-05-07 MED ORDER — INSULIN DETEMIR 100 UNIT/ML ~~LOC~~ SOLN
8.0000 [IU] | Freq: Every day | SUBCUTANEOUS | Status: DC
  Administered 2015-05-08: 8 [IU] via SUBCUTANEOUS
  Filled 2015-05-07 (×2): qty 0.08

## 2015-05-07 NOTE — Progress Notes (Signed)
Inpatient Diabetes Program Recommendations  AACE/ADA: New Consensus Statement on Inpatient Glycemic Control (2013)  Target Ranges:  Prepandial:   less than 140 mg/dL      Peak postprandial:   less than 180 mg/dL (1-2 hours)      Critically ill patients:  140 - 180 mg/dL   Reason for Visit: Referral received.  Note that patient admitted with hypoglycemia.  According to daughter, patient is seen at the [facility] for his insulin/diabetes management.  She states that she recently had a conversation with patient's MD regarding his diabetes, and that he decreased patients's 70/30 insulin from 20 units to 15 units q AM.  However patient states that he continued to give 70/30 20 units q AM.  He does draw up his insulin with vial and syringe.  Daughter states that he has lost approximately 50% of his vision and she is concerned that he is drawing up incorrectly.    According to patient he has had 2 episodes in the past 2 weeks where he "passed out" with no hypoglycemic symptoms prior.  He states that he sometimes skips meals or varies eating schedule.  A1C currently not available.  Explained to patient the importance of avoiding hypoglycemia by eating regularly, monitoring, and that activity such as working out can pre-dispose patient to low blood sugars.  He lives alone and daughter is concerned that he may need someone to check in on him routinely.  Briefly discussed the profile of 70/30 with patient and daughter and discussed the importance of eating regularly with this medication.  He has been on insulin for the past 3 years and endorses low blood sugars every 2-3 weeks.  He reports that when he is low he treats with OJ or glucose tablets.  Reinforced teaching on hypoglycemia treatment.    May benefit from insulin pen at discharge instead of vial and syringe for more accurate dosing?  Also, would he do better with insulin such as Levemir 8 units daily (to avoid peaks of pre-mixed insulin)?  Discussed with RN.  She  states she will allow patient to practice drawing up saline to see if he is drawing up correctly also.  Will follow up on 7/19. Daughter states she wants to make sure that the plan of care is communicated to the [place] also.  Adah Perl, RN, BC-ADM Inpatient Diabetes Coordinator Pager [phone] (8a-5p)

## 2015-05-07 NOTE — Plan of Care (Signed)
Problem: Discharge Progression Outcomes Goal: Patient states knowledge of home Diabetes Mellitus meds Outcome: Progressing  Using teach back method with IV syringed as ordered with sliding scale  Consult ordered for diabetes RN to see pt, teach about diet and insulin coverage  Pt is alert and verbalizes understanding of teaching at bedside.

## 2015-05-07 NOTE — Plan of Care (Signed)
Problem: Discharge Progression Outcomes Goal: Other Discharge Outcomes/Goals Plan of care progress to goal: Hemodynamically: VSS Diet: good appetite Activity: standby assist

## 2015-05-07 NOTE — Consult Note (Signed)
ENDOCRINOLOGY CONSULTATION  REFERRING PHYSICIAN:  Molinda Bailiff, MD CONSULTING PHYSICIAN:  A. Lavone Orn, MD  CHIEF COMPLAINT:  Hypoglycemia and diabetic mellitus   HISTORY OF PRESENT ILLNESS:  81 y.o. male with type 2 diabetes was admitted yesterday after being brought in by EMS for severe hypoglycemia with altered mental status. Patient was at home where daughter found him down and confused. Initial BG was in 20s. This occurred in the mid afternoon. Glucagon given by EMS. Was then brought to [facility] ED where BG was in 20s. Was treated with juice and sugars improved. Patient seen with daughter at bedside.  Diabetes is managed by PCP, Keith Davila, as well as his [place] Physician, Keith. Spero Davila. He is prescribed Januvia, glipizide, and NPH/Regular 70/30 Mix insulin. Dose of glipizide not clear, as notes from Keith. Sabra Davila indicate he is to take 10 mg bid however it is actually prescribed by Keith. Spero Davila and patient reports he takes two tabs of glipizide bid. Additionally, insulin was reduced to 15 units bid last month however he is taking 15-20 units bid, depending on pre-meal sugar. However he is only checking his blood sugars in the mornings before breakfast. Recalls that fasting sugars are in the 150-200 range. He recalls two other episodes of hypoglycemia with severe symptoms also occuring in the afternoon. He typically only eats two meals daily. Takes sulfonylurea and insulin before each of these meals. Last Hgb A1c on 05/04/15 was 7.4%.  PAST MEDICAL HISTORY:  Type 2 diabetes HTN Prostate cancer Sarcoidosis Stage 3 CKD Hyperlipidemia  CURRENT MEDICATIONS:  . aspirin EC  81 mg Oral Daily  . atorvastatin  40 mg Oral q1800  . docusate sodium  100 mg Oral BID  . heparin  5,000 Units Subcutaneous 3 times per day  . insulin aspart  0-9 Units Subcutaneous TID WC  . [START ON 05/08/2015] insulin detemir  8 Units Subcutaneous Daily  . metoprolol succinate  50 mg Oral Daily  . oxybutynin  5 mg Oral QHS   . pantoprazole  40 mg Oral Daily     SOCIAL HISTORY:  History  Substance Use Topics  . Smoking status: Never Smoker   . Smokeless tobacco: Not on file  . Alcohol Use: No     FAMILY HISTORY:   Family History  Problem Relation Age of Onset  . Diabetes Other      ALLERGIES:  Allergies  Allergen Reactions  . Prevacid [Lansoprazole]     REVIEW OF SYSTEMS:  GENERAL:  Reports 8-9 lb weight loss in last month. Good appetite. No fever.  HEENT:  No blurred vision. No sore throat.  NECK:  No neck pain or dysphagia.  CARDIAC:  No chest pain or palpitation.  PULMONARY:  No cough or shortness of breath.  ABDOMEN:  No abdominal pain.  No constipation. EXTREMITIES:  No lower extremity swelling.  ENDOCRINE:  No heat or cold intolerance.  GENITOURINARY:  No dysuria or hematuria. SKIN:  No recent rash or skin changes.   PHYSICAL EXAMINATION:  BP 130/85 mmHg  Pulse 76  Temp(Src) 98.4 F (36.9 C) (Oral)  Resp 18  Ht 6\' 2"  (1.88 m)  Wt 89.812 kg (198 lb)  BMI 25.41 kg/m2  SpO2 100%  GENERAL:  Well-developed male in NAD. HEENT:  EOMI.  Oropharynx is clear.  NECK:  Supple.  No thyromegaly.  No neck tenderness.  CARDIAC:  Regular rate and rhythm without murmur.  PULMONARY:  Clear to auscultation bilaterally. No wheeze. ABDOMEN:  Diffusely soft, nontender, nondistended.  EXTREMITIES:  No peripheral edema is present.  Nml motor tone. SKIN:  No rash or dermatopathy. NEUROLOGIC:  No dysarthria.  No tremor. PSYCHIATRIC:  Alert and oriented, calm, cooperative.   LABORATORY DATA  Collection Time: 05/06/15 10:01 PM  Result Value Ref Range   Color, Urine STRAW (A) YELLOW   APPearance CLEAR (A) CLEAR   Glucose, UA 150 (A) NEGATIVE mg/dL   Bilirubin Urine NEGATIVE NEGATIVE   Ketones, ur NEGATIVE NEGATIVE mg/dL   Specific Gravity, Urine 1.014 1.005 - 1.030   Hgb urine dipstick NEGATIVE NEGATIVE   pH 6.0 5.0 - 8.0   Protein, ur 100 (A) NEGATIVE mg/dL   Nitrite NEGATIVE NEGATIVE    Leukocytes, UA NEGATIVE NEGATIVE   RBC / HPF 0-5 0 - 5 RBC/hpf   WBC, UA 0-5 0 - 5 WBC/hpf   Bacteria, UA NONE SEEN NONE SEEN   Squamous Epithelial / LPF NONE SEEN NONE SEEN   Mucous PRESENT   Glucose, capillary     Status: Abnormal   Collection Time: 05/07/15 12:04 AM  Result Value Ref Range   Glucose-Capillary 229 (H) 65 - 99 mg/dL  Glucose, capillary     Status: Abnormal   Collection Time: 05/07/15  3:58 AM  Result Value Ref Range   Glucose-Capillary 262 (H) 65 - 99 mg/dL   Comment 1 Notify RN   Basic metabolic panel     Status: Abnormal   Collection Time: 05/07/15  5:01 AM  Result Value Ref Range   Sodium 136 135 - 145 mmol/L   Potassium 4.0 3.5 - 5.1 mmol/L   Chloride 102 101 - 111 mmol/L   CO2 27 22 - 32 mmol/L   Glucose, Bld 235 (H) 65 - 99 mg/dL   BUN 29 (H) 6 - 20 mg/dL   Creatinine, Ser 1.35 (H) 0.61 - 1.24 mg/dL   Calcium 8.5 (L) 8.9 - 10.3 mg/dL   GFR calc non Af Amer 47 (L) >60 mL/min   GFR calc Af Amer 54 (L) >60 mL/min   Anion gap 7 5 - 15   ASSESSMENT:  1. Diabetes mellitus type 2 with hypoglycemia  PLAN: 1. Hypoglycemia in setting of type 2 diabetes managed with sulfonylurea and insulin. Likely with inadequate PO intake and recent weight loss, contributing to increased insulin sensitivity.  2. As his recent episodes of hypoglycemia have all occurred in the afternoon, this indicates that morning medications need to be adjusted. Sulfonylurea just as likely, perhaps more so, to be contributing to hypoglycemia as insulin. As he is not sure about his dosing of the glipizide, I recommend that the morning dose of glipizide be stopped. Continue evening dose of glipizide. 3. For now, continue NPH/Regular 70/30 Mix insulin at 15 units twice daily. While a long-short acting insulin combination such as Levemir + NovoLog could potentially less likely cause hypoglycemia, changing the insulin may contribute to further confusion. 4. He will need close out-patient follow up with  Keith. Sabra Davila.   Thank you for the kind request for consultation.

## 2015-05-07 NOTE — Care Management (Signed)
Admitted to [facility] with the diagnosis of hypoglycemia. Lives alone. Friend is Erline Levine [phone]) other friend is Jennette Bill [phone]). Sees Dr. Emily Filbert. Last seen 6 months ago. No home health in the past. [place] in 2006 x 45 days.  Uses a cane to aid in ambulation, still drives. Friends will transport.  Diabetic Disease for many years.  Shelbie Ammons RN MSN Care Management [phone]

## 2015-05-07 NOTE — Progress Notes (Signed)
[place] at [place] NAME: Keith Davila    MR#:  [id number]  DATE OF BIRTH:  1929-01-20  SUBJECTIVE:  CHIEF COMPLAINT:  Pt is resting comfortably with no complaints. According to daughter, patient goes to [place] for his diabetes management. She states that she recently had a conversation with patient's MD regarding his diabetes, and that he decreased patients's 70/30 insulin from 20 units to 15 units q AM. However patient states that he continued to give 70/30 20 units q AM. Daughter states that he has lost approximately 50% of his vision and she is concerned that he is drawing up incorrectly.   REVIEW OF SYSTEMS:  CONSTITUTIONAL: No fever, fatigue or weakness.  EYES: No blurred or double vision.  EARS, NOSE, AND THROAT: No tinnitus or ear pain.  RESPIRATORY: No cough, shortness of breath, wheezing or hemoptysis.  CARDIOVASCULAR: No chest pain, orthopnea, edema.  GASTROINTESTINAL: No nausea, vomiting, diarrhea or abdominal pain.  GENITOURINARY: No dysuria, hematuria.  ENDOCRINE: No polyuria, nocturia,  HEMATOLOGY: No anemia, easy bruising or bleeding SKIN: No rash or lesion. MUSCULOSKELETAL: No joint pain or arthritis.   NEUROLOGIC: No tingling, numbness, weakness.  PSYCHIATRY: No anxiety or depression.   DRUG ALLERGIES:   Allergies  Allergen Reactions  . Prevacid [Lansoprazole]     VITALS:  Blood pressure 125/77, pulse 69, temperature 97.9 F (36.6 C), temperature source Oral, resp. rate 18, height 6\' 2"  (1.88 m), weight 89.812 kg (198 lb), SpO2 100 %.  PHYSICAL EXAMINATION:  GENERAL:  81 y.o.-year-old patient lying in the bed with no acute distress.  EYES: Pupils equal, round, reactive to light and accommodation. No scleral icterus. Extraocular muscles intact.  HEENT: Head atraumatic, normocephalic. Oropharynx and nasopharynx clear.  NECK:  Supple, no jugular venous distention. No thyroid enlargement, no tenderness.   LUNGS: Normal breath sounds bilaterally, no wheezing, rales,rhonchi or crepitation. No use of accessory muscles of respiration.  CARDIOVASCULAR: S1, S2 normal. No murmurs, rubs, or gallops.  ABDOMEN: Soft, nontender, nondistended. Bowel sounds present. No organomegaly or mass.  EXTREMITIES: No pedal edema, cyanosis, or clubbing.  NEUROLOGIC: Cranial nerves II through XII are intact. Muscle strength 5/5 in all extremities. Sensation intact. Gait not checked.  PSYCHIATRIC: The patient is alert and oriented x 3.  SKIN: No obvious rash, lesion, or ulcer.    LABORATORY PANEL:   CBC  Recent Labs Lab 05/06/15 1908  WBC 7.4  HGB 12.1*  HCT 37.5*  PLT 223   ------------------------------------------------------------------------------------------------------------------  Chemistries   Recent Labs Lab 05/06/15 1908 05/07/15 0501  NA 133* 136  K 3.3* 4.0  CL 95* 102  CO2 28 27  GLUCOSE 78 235*  BUN 29* 29*  CREATININE 1.44* 1.35*  CALCIUM 8.8* 8.5*  AST 38  --   ALT 22  --   ALKPHOS 66  --   BILITOT 0.3  --    ------------------------------------------------------------------------------------------------------------------  Cardiac Enzymes  Recent Labs Lab 05/06/15 1908  TROPONINI <0.03   ------------------------------------------------------------------------------------------------------------------  RADIOLOGY:  Dg Chest Portable 1 View  05/06/2015   CLINICAL DATA:  Altered mental status  EXAM: PORTABLE CHEST - 1 VIEW  COMPARISON:  None.  FINDINGS: Cardiac shadow is enlarged. Aortic calcifications are noted. The lungs are well aerated bilaterally with minimal bibasilar atelectasis. No sizable effusion is seen. No acute bony abnormality is noted. Calcified granulomas are noted bilaterally.  IMPRESSION: Bibasilar atelectasis.  Aortic atherosclerotic disease.   Electronically Signed   By: Linus Mako.D.  On: 05/06/2015 19:25    EKG:   Orders placed or performed  during the hospital encounter of 05/06/15  . ED EKG  . ED EKG  . EKG 12-Lead  . EKG 12-Lead    ASSESSMENT AND PLAN:  81 year old African gentleman history of type 2 diabetes treated with insulin, essential hypertension who presented with altered mental status found to be hypoglycemic  1. Hypoglycemia related to type 2 diabetes:probably from excessive insulin use vs incorrect drawing2/2 vision probs   Glucose has improved after receiving glucagon as well as some oral intake  continue with Accu-Cheks  RN to allow patient to practice drawing up saline to see if he is drawing up correctly also Levemir pen with clicking sounds might be beneficial Diabetic RN co-ordinator consult - appreciate their input Diabetis and insulin education . hold diuretic agents Start levemir 8 u tonight  2. Essential hypertension: Hold diuretics, continue metoprolol 3. Hyperlipidemia unspecified: Statin therapy 4. GERD without esophagitis: PPI therapy 5. Venous thromboembolism prophylactic: Heparin subcutaneous       All the records are reviewed and case discussed with Care Management/Social Workerr. Management plans discussed with the patient, family and they are in agreement.  CODE STATUS: Full  TOTAL TIME TAKING CARE OF THIS PATIENT: 35 minutes.   POSSIBLE D/C IN 1-2 DAYS, DEPENDING ON CLINICAL CONDITION.   Nicholes Mango M.D on 05/07/2015 at 7:02 PM  Between 7am to 6pm - Pager - [phone] After 6pm go to [email or web address] - password [email or web address] [facility] [facility]  Office  [phone]  CC: Primary care physician; Faye Ramsay, FNP

## 2015-05-08 LAB — GLUCOSE, CAPILLARY
GLUCOSE-CAPILLARY: 163 mg/dL — AB (ref 65–99)
Glucose-Capillary: 153 mg/dL — ABNORMAL HIGH (ref 65–99)
Glucose-Capillary: 181 mg/dL — ABNORMAL HIGH (ref 65–99)
Glucose-Capillary: 335 mg/dL — ABNORMAL HIGH (ref 65–99)

## 2015-05-08 LAB — HEMOGLOBIN A1C: HEMOGLOBIN A1C: 7.3 % — AB (ref 4.0–6.0)

## 2015-05-08 MED ORDER — ACETAMINOPHEN 325 MG PO TABS: 650.0000 mg | ORAL_TABLET | Freq: Four times a day (QID) | ORAL | Status: DC | PRN

## 2015-05-08 MED ORDER — INSULIN DETEMIR 100 UNIT/ML ~~LOC~~ SOLN: 8.0000 [IU] | Freq: Every day | SUBCUTANEOUS | Status: DC

## 2015-05-08 NOTE — Discharge Summary (Addendum)
[place] at [place] NAME: Keith Davila    MR#:  [id number]  [place]:  1929/09/11  DATE OF ADMISSION:  05/06/2015 ADMITTING PHYSICIAN: Lytle Butte, MD  DATE OF DISCHARGE: 05/08/2015 PRIMARY CARE PHYSICIAN: Faye Ramsay, FNP    ADMISSION DIAGNOSIS:  Hypoglycemia [E16.2]  DISCHARGE DIAGNOSIS:  Principal Problem:   Hypoglycemia associated with diabetes   SECONDARY DIAGNOSIS:   Past Medical History  Diagnosis Date  . Hypertension   . Diabetes mellitus without complication   . Renal disorder   . prostate     HOSPITAL COURSE:   81 year old African gentleman history of type 2 diabetes treated with insulin, essential hypertension who presented with altered mental status found to be hypoglycemic  1. Hypoglycemia related to type 2 diabetes:probably from excessive insulin use vs incorrect drawing2/2 vision probs  Glucose has improved after receiving glucagon as well as some oral intake Will discharge patient with Levemir pen with clicking sounds might be beneficial Diabetic RN co-ordinator consult - we have provided Diabetis and insulin education . Continue levemir 8 units daily, discontinued 70/30 Resume his home medications including Januvia and glipizide Outpatient follow-up with diabetic clinic in 1-2 weeks  2. Essential hypertension:  diu resumeretics, continue metoprolol 3. Hyperlipidemia unspecified: Statin therapy 4. GERD without esophagitis: PPI therapy 5. Venous thromboembolism prophylactic: Heparin subcutaneous    DISCHARGE CONDITIONS:   Satisfactory  CONSULTS OBTAINED:  Treatment Team:  Lytle Butte, MD   PROCEDURES none  DRUG ALLERGIES:   Allergies  Allergen Reactions  . Prevacid [Lansoprazole]     DISCHARGE MEDICATIONS:   Current Discharge Medication List    START taking these medications   Details  acetaminophen (TYLENOL) 325 MG tablet Take 2 tablets (650 mg total) by mouth  every 6 (six) hours as needed for mild pain (or Fever >/= 101).    insulin detemir (LEVEMIR) 100 UNIT/ML injection Inject 0.08 mLs (8 Units total) into the skin daily. Qty: 10 mL, Refills: 11      CONTINUE these medications which have NOT CHANGED   Details  aspirin 81 MG tablet Take 81 mg by mouth daily.    calcium-vitamin D (OSCAL WITH D) 500-200 MG-UNIT per tablet Take 1 tablet by mouth.    ceditoren (SPECTRACEF) 200 MG tablet Take 200 mg by mouth 2 (two) times daily.    docusate sodium (COLACE) 100 MG capsule Take 100 mg by mouth 2 (two) times daily.    glipiZIDE (GLUCOTROL) 10 MG tablet Take 10 mg by mouth 2 (two) times daily before a meal.    hydrochlorothiazide (HYDRODIURIL) 25 MG tablet Take 25 mg by mouth daily.    metoprolol succinate (TOPROL-XL) 50 MG 24 hr tablet Take 50 mg by mouth daily. Take with or immediately following a meal.    omeprazole (PRILOSEC) 20 MG capsule Take 20 mg by mouth 2 (two) times daily before a meal.    simvastatin (ZOCOR) 80 MG tablet Take 80 mg by mouth daily.    sitaGLIPtin (JANUVIA) 50 MG tablet Take 50 mg by mouth daily.    tolterodine (DETROL) 1 MG tablet Take 1 mg by mouth daily.      STOP taking these medications     insulin NPH-regular Human (NOVOLIN 70/30) (70-30) 100 UNIT/ML injection          DISCHARGE INSTRUCTIONS:   follow-up with primary care physician at [facility] in a week   follow-up with ophthalmology for vision check in 3-5 days  or sooner as needed, instructed not to drive until seen and cleared by ophthalmology  Follow-up with outpatient diabetic clinic in 1-2 weeks   DIET:  Diabetic diet, low-salt, low-fat   DISCHARGE CONDITION:  Stable  ACTIVITY:  Activity as tolerated  OXYGEN:  Home Oxygen: No.   Oxygen Delivery: room air  DISCHARGE LOCATION:  home   If you experience worsening of your admission symptoms, develop shortness of breath, life threatening emergency, suicidal or homicidal thoughts you must  seek medical attention immediately by calling 911 or calling your MD immediately  if symptoms less severe.  You Must read complete instructions/literature along with all the possible adverse reactions/side effects for all the Medicines you take and that have been prescribed to you. Take any new Medicines after you have completely understood and accpet all the possible adverse reactions/side effects.   Please note  You were cared for by a hospitalist during your hospital stay. If you have any questions about your discharge medications or the care you received while you were in the hospital after you are discharged, you can call the unit and asked to speak with the hospitalist on call if the hospitalist that took care of you is not available. Once you are discharged, your primary care physician will handle any further medical issues. Please note that NO REFILLS for any discharge medications will be authorized once you are discharged, as it is imperative that you return to your primary care physician (or establish a relationship with a primary care physician if you do not have one) for your aftercare needs so that they can reassess your need for medications and monitor your lab values.     Today  Chief Complaint  Patient presents with  . Hypoglycemia   Patient is resting comfortably. Denies any blurry vision or black spots or floaters. Daughter is at bedside  ROS: None CONSTITUTIONAL: Denies fevers, chills. Denies any fatigue, weakness.  EYES: Denies blurry vision, double vision, eye pain. EARS, NOSE, THROAT: Denies tinnitus, ear pain, hearing loss. RESPIRATORY: Denies cough, wheeze, shortness of breath.  CARDIOVASCULAR: Denies chest pain, palpitations, edema.  GASTROINTESTINAL: Denies nausea, vomiting, diarrhea, abdominal pain. Denies bright red blood per rectum. GENITOURINARY: Denies dysuria, hematuria. ENDOCRINE: Denies nocturia or thyroid problems. HEMATOLOGIC AND LYMPHATIC: Denies easy  bruising or bleeding. SKIN: Denies rash or lesion. MUSCULOSKELETAL: Denies pain in neck, back, shoulder, knees, hips or arthritic symptoms.  NEUROLOGIC: Denies paralysis, paresthesias.  PSYCHIATRIC: Denies anxiety or depressive symptoms.   VITAL SIGNS:  Blood pressure 127/77, pulse 73, temperature 98.5 F (36.9 C), temperature source Oral, resp. rate 18, height 6\' 2"  (1.88 m), weight 89.812 kg (198 lb), SpO2 98 %.  I/O:   Intake/Output Summary (Last 24 hours) at 05/08/15 1114 Last data filed at 05/08/15 0359  Gross per 24 hour  Intake    240 ml  Output    350 ml  Net   -110 ml    PHYSICAL EXAMINATION:  GENERAL:  81 y.o.-year-old patient lying in the bed with no acute distress.  EYES: Pupils equal, round, reactive to light and accommodation. No scleral icterus. Extraocular muscles intact.  HEENT: Head atraumatic, normocephalic. Oropharynx and nasopharynx clear.  NECK:  Supple, no jugular venous distention. No thyroid enlargement, no tenderness.  LUNGS: Normal breath sounds bilaterally, no wheezing, rales,rhonchi or crepitation. No use of accessory muscles of respiration.  CARDIOVASCULAR: S1, S2 normal. No murmurs, rubs, or gallops.  ABDOMEN: Soft, non-tender, non-distended. Bowel sounds present. No organomegaly or mass.  EXTREMITIES:  No pedal edema, cyanosis, or clubbing.  NEUROLOGIC: Cranial nerves II through XII are intact. Muscle strength 5/5 in all extremities. Sensation intact. Gait not checked.  PSYCHIATRIC: The patient is alert and oriented x 3.  SKIN: No obvious rash, lesion, or ulcer.   DATA REVIEW:   CBC  Recent Labs Lab 05/06/15 1908  WBC 7.4  HGB 12.1*  HCT 37.5*  PLT 223    Chemistries   Recent Labs Lab 05/06/15 1908 05/07/15 0501  NA 133* 136  K 3.3* 4.0  CL 95* 102  CO2 28 27  GLUCOSE 78 235*  BUN 29* 29*  CREATININE 1.44* 1.35*  CALCIUM 8.8* 8.5*  AST 38  --   ALT 22  --   ALKPHOS 66  --   BILITOT 0.3  --     Cardiac Enzymes  Recent  Labs Lab 05/06/15 1908  TROPONINI <0.03    Microbiology Results  No results found for this or any previous visit.  RADIOLOGY:  Dg Chest Portable 1 View  05/06/2015   CLINICAL DATA:  Altered mental status  EXAM: PORTABLE CHEST - 1 VIEW  COMPARISON:  None.  FINDINGS: Cardiac shadow is enlarged. Aortic calcifications are noted. The lungs are well aerated bilaterally with minimal bibasilar atelectasis. No sizable effusion is seen. No acute bony abnormality is noted. Calcified granulomas are noted bilaterally.  IMPRESSION: Bibasilar atelectasis.  Aortic atherosclerotic disease.   Electronically Signed   By: Inez Catalina M.D.   On: 05/06/2015 19:25    EKG:   Orders placed or performed during the hospital encounter of 05/06/15  . ED EKG  . ED EKG  . EKG 12-Lead  . EKG 12-Lead      Management plans discussed with the patient, family and they are in agreement.  CODE STATUS:     Code Status Orders        Start     Ordered   05/06/15 2105  Full code   Continuous     05/06/15 2105      TOTAL TIME TAKING CARE OF THIS PATIENT, coordination of care, discussing with the patient, his daughter, RN, diabetic coordinator and care Freight forwarder, paperwork: 45 minutes.    @[facility] @  on 05/08/2015 at 11:14 AM  Between 7am to 6pm - Pager - [phone]  After 6pm go to [email or web address] - password [email or web address] [place] [facility]  Office  [phone]  CC: Primary care physician; Faye Ramsay, FNP  Addendum :  pts  Insurance doesn't cover levemir but lantus will be covered. Changed levemir to Lantus 8 units to start from tomorrow. D/w Sharon

## 2015-05-08 NOTE — Progress Notes (Signed)
Inpatient Diabetes Program Recommendations  AACE/ADA: New Consensus Statement on Inpatient Glycemic Control (2013)  Target Ranges:  Prepandial:   less than 140 mg/dL      Peak postprandial:   less than 180 mg/dL (1-2 hours)      Critically ill patients:  140 - 180 mg/dL   Reason for Visit:  Taught patient and daughter to use insulin pen at bedside.  Included instruction to put needle on insulin pen, 2 unit prime, injection of insulin, and holding in place for 6 seconds.  He was able to return demonstration and daughter was present during instruction.  Gave them insulin pen starter kit which included glucose log, glucose tablets and handout on injecting insulin with insulin pen.  Explained to patient that this would replace the insulin he had at home from the [place].  Also explained importance of monitoring and discussed goal blood sugars.  He state that his doctor at the [place] says his goal blood sugar should be around 150 mg/dL. Told him to let MD at the [facility] know if blood sugars are higher or lower that goals so that adjustments of medications can be done. He states he will test 3 times a day and more often if he feels "funny".  Gave them handout to place on refrigerator regarding signs and symptoms and treatment of hypoglycemia also. Daughter has requested all records from the hospital and plans to deliver them to the [place] so that his doctors there will know what is going on.  Patient was concerned about not being able to drive.  Attempted to reiterate importance of safety and the prevention of hypoglycemia.  Thanks, Adah Perl, RN, BC-ADM Inpatient Diabetes Coordinator Pager [phone] (8a-5p)

## 2015-05-08 NOTE — Plan of Care (Signed)
Problem: Discharge Progression Outcomes Goal: Other Discharge Outcomes/Goals Individualization of Care Pt prefers to be called Keith Davila Hx of HTN and prostate cancer which are controlled by home medication  Plan of care progress to goal: Pain:  Denies pain Hemodynamically: Stable BP 126/79 mmHg  Pulse 75  Temp(Src) 98.4 F (36.9 C) (Oral)  Resp 18  Ht 6\' 2"  (1.88 m)  Wt 198 lb (89.812 kg)  BMI 25.41 kg/m2  SpO2 100% Diet: good appetite Activity: standby assist - in chair for 3 hours this shift.  Moderate fall risk

## 2015-05-08 NOTE — Discharge Instructions (Signed)
Follow-up Primary care physician at [facility] in a week Follow-up with ophthalmology in 3-4 days or sooner as needed and instructed not to drive until cleared by ophthalmology Follow-up with diabetic clinic-lifestyle in 1-2 weeks Activity as tolerated Diet-diabetic , healthy heart

## 2015-05-10 LAB — GLUCOSE, CAPILLARY: Glucose-Capillary: 281 mg/dL — ABNORMAL HIGH (ref 65–99)

## 2016-07-09 ENCOUNTER — Encounter (INDEPENDENT_AMBULATORY_CARE_PROVIDER_SITE_OTHER): Payer: Self-pay

## 2016-07-09 DIAGNOSIS — I10 Essential (primary) hypertension: Secondary | ICD-10-CM

## 2016-07-09 DIAGNOSIS — I8390 Asymptomatic varicose veins of unspecified lower extremity: Secondary | ICD-10-CM | POA: Insufficient documentation

## 2016-07-09 DIAGNOSIS — I739 Peripheral vascular disease, unspecified: Secondary | ICD-10-CM

## 2016-08-01 ENCOUNTER — Other Ambulatory Visit (INDEPENDENT_AMBULATORY_CARE_PROVIDER_SITE_OTHER): Payer: Self-pay | Admitting: Vascular Surgery

## 2016-08-01 DIAGNOSIS — M79605 Pain in left leg: Secondary | ICD-10-CM

## 2016-08-01 DIAGNOSIS — I739 Peripheral vascular disease, unspecified: Secondary | ICD-10-CM

## 2016-08-01 DIAGNOSIS — M79604 Pain in right leg: Secondary | ICD-10-CM

## 2016-08-05 ENCOUNTER — Encounter (INDEPENDENT_AMBULATORY_CARE_PROVIDER_SITE_OTHER): Payer: Medicare Other

## 2016-08-05 ENCOUNTER — Ambulatory Visit (INDEPENDENT_AMBULATORY_CARE_PROVIDER_SITE_OTHER): Payer: Self-pay | Admitting: Vascular Surgery

## 2016-08-08 ENCOUNTER — Ambulatory Visit (INDEPENDENT_AMBULATORY_CARE_PROVIDER_SITE_OTHER): Payer: Medicare Other

## 2016-08-08 ENCOUNTER — Ambulatory Visit (INDEPENDENT_AMBULATORY_CARE_PROVIDER_SITE_OTHER): Payer: Medicare Other | Admitting: Vascular Surgery

## 2016-08-08 ENCOUNTER — Encounter (INDEPENDENT_AMBULATORY_CARE_PROVIDER_SITE_OTHER): Payer: Self-pay | Admitting: Vascular Surgery

## 2016-08-08 VITALS — BP 182/80 | HR 62 | Resp 16 | Ht 74.0 in | Wt 192.0 lb

## 2016-08-08 DIAGNOSIS — E118 Type 2 diabetes mellitus with unspecified complications: Secondary | ICD-10-CM | POA: Diagnosis not present

## 2016-08-08 DIAGNOSIS — E782 Mixed hyperlipidemia: Secondary | ICD-10-CM | POA: Diagnosis not present

## 2016-08-08 DIAGNOSIS — M79604 Pain in right leg: Secondary | ICD-10-CM

## 2016-08-08 DIAGNOSIS — I10 Essential (primary) hypertension: Secondary | ICD-10-CM

## 2016-08-08 DIAGNOSIS — M79605 Pain in left leg: Secondary | ICD-10-CM

## 2016-08-08 DIAGNOSIS — I739 Peripheral vascular disease, unspecified: Secondary | ICD-10-CM

## 2016-08-08 DIAGNOSIS — M79609 Pain in unspecified limb: Secondary | ICD-10-CM | POA: Diagnosis not present

## 2016-08-08 NOTE — Assessment & Plan Note (Signed)
lipid control important in reducing the progression of atherosclerotic disease. Continue statin therapy  

## 2016-08-08 NOTE — Progress Notes (Signed)
MRN : [id number]  Keith Davila is a 82 y.o. (Jun 06, 1929) male who presents with chief complaint of  Chief Complaint  Patient presents with  . Follow-up  .  History of Present Illness: Patient returns today in follow up of His peripheral vascular disease. He has some instability on his feet and some significant arthritis and walks with a cane. He remains reasonably active despite his advanced age. He does not have any current ischemic rest pain or tissue loss. The patient's noninvasive studies today demonstrate ABIs of 1.14 on the right and 0.98 on the left. His waveforms and his ABIs do not correlate suggesting medial calcification of the tibial vessels bilaterally. His digital pressures are good bilaterally however, suggesting that he has no severe arterial insufficiency.  Current Outpatient Prescriptions  Medication Sig Dispense Refill  . acetaminophen (TYLENOL) 325 MG tablet Take 2 tablets (650 mg total) by mouth every 6 (six) hours as needed for mild pain (or Fever >/= 101).    Marland Kitchen aspirin 81 MG tablet Take 81 mg by mouth daily.    . brimonidine (ALPHAGAN) 0.2 % ophthalmic solution Apply to eye.    . calcium-vitamin D (OSCAL WITH D) 500-200 MG-UNIT per tablet Take 1 tablet by mouth.    . ceditoren (SPECTRACEF) 200 MG tablet Take 200 mg by mouth 2 (two) times daily.    Marland Kitchen docusate sodium (COLACE) 100 MG capsule Take 100 mg by mouth 2 (two) times daily.    Marland Kitchen glipiZIDE (GLUCOTROL) 10 MG tablet Take 10 mg by mouth 2 (two) times daily before a meal.    . hydrochlorothiazide (HYDRODIURIL) 25 MG tablet Take 25 mg by mouth daily.    Marland Kitchen latanoprost (XALATAN) 0.005 % ophthalmic solution Apply to eye.    . metoprolol (LOPRESSOR) 50 MG tablet Take by mouth.    Marland Kitchen omeprazole (PRILOSEC) 20 MG capsule Take 20 mg by mouth 2 (two) times daily before a meal.    . senna (SENOKOT) 8.6 MG tablet Take 1 tablet by mouth daily.    . simvastatin (ZOCOR) 80 MG tablet Take 80 mg by mouth daily.    . sitaGLIPtin  (JANUVIA) 50 MG tablet Take 50 mg by mouth daily.    . tamsulosin (FLOMAX) 0.4 MG CAPS capsule Take by mouth.    . timolol (BETIMOL) 0.5 % ophthalmic solution 1 drop 2 (two) times daily.    Marland Kitchen tolterodine (DETROL) 1 MG tablet Take 1 mg by mouth daily.    . insulin detemir (LEVEMIR) 100 UNIT/ML injection Inject 0.08 mLs (8 Units total) into the skin daily. (Patient not taking: Reported on 08/08/2016) 10 mL 11  . metoprolol succinate (TOPROL-XL) 50 MG 24 hr tablet Take 50 mg by mouth daily. Take with or immediately following a meal.     No current facility-administered medications for this visit.     Past Medical History:  Diagnosis Date  . Diabetes mellitus without complication ([place])   . Hyperlipidemia   . Hypertension   . Peripheral vascular disease ([place])   . prostate   . Renal disorder   . Varicose veins     Past Surgical History:  Procedure Laterality Date  . BACK SURGERY    . HERNIA REPAIR    . JOINT REPLACEMENT    . SPINE SURGERY      Social History Social History  Substance Use Topics  . Smoking status: Never Smoker  . Smokeless tobacco: Never Used  . Alcohol use No     Family  History Family History  Problem Relation Age of Onset  . Diabetes Other   . Diabetes Mother   . Heart attack Father      Allergies  Allergen Reactions  . Prevacid [Lansoprazole]      REVIEW OF SYSTEMS (Negative unless checked)  Constitutional: [] Weight loss  [] Fever  [] Chills Cardiac: [] Chest pain   [] Chest pressure   [] Palpitations   [] Shortness of breath when laying flat   [] Shortness of breath at rest   [] Shortness of breath with exertion. Vascular:  [x] Pain in legs with walking   [] Pain in legs at rest   [] Pain in legs when laying flat   [] Claudication   [] Pain in feet when walking  [] Pain in feet at rest  [] Pain in feet when laying flat   [] History of DVT   [] Phlebitis   [] Swelling in legs   [] Varicose veins   [] Non-healing ulcers Pulmonary:   [] Uses home oxygen   [] Productive  cough   [] Hemoptysis   [] Wheeze  [] COPD   [] Asthma Neurologic:  [] Dizziness  [] Blackouts   [] Seizures   [] History of stroke   [] History of TIA  [] Aphasia   [] Temporary blindness   [] Dysphagia   [] Weakness or numbness in arms   [] Weakness or numbness in legs Musculoskeletal:  [x] Arthritis   [] Joint swelling   [x] Joint pain   [] Low back pain Hematologic:  [] Easy bruising  [] Easy bleeding   [] Hypercoagulable state   [] Anemic   Gastrointestinal:  [] Blood in stool   [] Vomiting blood  [] Gastroesophageal reflux/heartburn   [] Abdominal pain Genitourinary:  [] Chronic kidney disease   [] Difficult urination  [] Frequent urination  [] Burning with urination   [] Hematuria Skin:  [] Rashes   [] Ulcers   [] Wounds Psychological:  [] History of anxiety   []  History of major depression.  Physical Examination  BP (!) 182/80   Pulse 62   Resp 16   Ht 6\' 2"  (1.88 m)   Wt 192 lb (87.1 kg)   BMI 24.65 kg/m  Gen:  WD/WN, NAD. Appears younger than stated age Head: [place]/AT, No temporalis wasting. Ear/Nose/Throat: Hearing grossly intact, nares w/o erythema or drainage, trachea midline Eyes: PERRLA. Sclera non-icteric Neck: Supple, no nuchal rigidity.  No JVD.  Pulmonary:  Good air movement, no use of accessory muscles.  Cardiac: RRR, normal S1, S2 Vascular:  Vessel Right Left  Radial Palpable Palpable  Ulnar Palpable Palpable  Brachial Palpable Palpable  Carotid Palpable, without bruit Palpable, without bruit  Aorta Not palpable N/A  Femoral Palpable Palpable  Popliteal Palpable Palpable  PT Palpable 1+ Palpable  DP 1+, Palpable Palpable   Gastrointestinal: soft, non-tender/non-distended. No guarding/reflex.  Musculoskeletal:  No deformity or atrophy. Trace BLE edema. Walks with a cane. Neurologic:. Pain and light touch intact in extremities.  Symmetrical.  Speech is fluent.  Psychiatric: Judgment intact, Mood & affect appropriate for pt's clinical situation. Dermatologic: No rashes or ulcers noted.  No  cellulitis or open wounds. Lymph : No Cervical, Axillary, or Inguinal lymphadenopathy.      Labs No results found for this or any previous visit (from the past 2160 hour(s)).  Radiology No results found.    Assessment/Plan  Combined fat and carbohydrate induced hyperlipemia lipid control important in reducing the progression of atherosclerotic disease. Continue statin therapy   Complication of diabetes mellitus ([place]) blood glucose control important in reducing the progression of atherosclerotic disease. Also, involved in wound healing. On appropriate medications.   Essential hypertension blood pressure control important in reducing the progression of atherosclerotic disease.  On appropriate oral medications.   Peripheral vascular disease ([place]) The patient's noninvasive studies today demonstrate ABIs of 1.14 on the right and 0.98 on the left. His waveforms and his ABIs do not correlate suggesting medial calcification of the tibial vessels bilaterally. His digital pressures are good bilaterally however, suggesting that he has no severe arterial insufficiency. Given the above findings, I do not think any lower extremity pain is a significant result of poor perfusion. I think we can continue to check his mild peripheral vascular disease on an every other year basis. He will contact our office with problems in the interim.    Leotis Pain, MD  08/08/2016 4:34 PM    This note was created with Dragon medical transcription system.  Any errors from dictation are purely unintentional

## 2016-08-08 NOTE — Assessment & Plan Note (Signed)
blood glucose control important in reducing the progression of atherosclerotic disease. Also, involved in wound healing. On appropriate medications.  

## 2016-08-08 NOTE — Assessment & Plan Note (Signed)
The patient's noninvasive studies today demonstrate ABIs of 1.14 on the right and 0.98 on the left. His waveforms and his ABIs do not correlate suggesting medial calcification of the tibial vessels bilaterally. His digital pressures are good bilaterally however, suggesting that he has no severe arterial insufficiency. Given the above findings, I do not think any lower extremity pain is a significant result of poor perfusion. I think we can continue to check his mild peripheral vascular disease on an every other year basis. He will contact our office with problems in the interim.

## 2016-08-08 NOTE — Assessment & Plan Note (Signed)
blood pressure control important in reducing the progression of atherosclerotic disease. On appropriate oral medications.  

## 2016-12-15 ENCOUNTER — Ambulatory Visit
Admission: RE | Admit: 2016-12-15 | Discharge: 2016-12-15 | Disposition: A | Discharge status: Home / Self Care | Payer: Medicare Other | Source: Ambulatory Visit | Attending: Student | Admitting: Student

## 2016-12-15 ENCOUNTER — Other Ambulatory Visit: Payer: Self-pay | Admitting: Student

## 2016-12-15 DIAGNOSIS — R1310 Dysphagia, unspecified: Secondary | ICD-10-CM | POA: Diagnosis present

## 2016-12-15 DIAGNOSIS — K449 Diaphragmatic hernia without obstruction or gangrene: Secondary | ICD-10-CM | POA: Insufficient documentation

## 2016-12-15 DIAGNOSIS — K222 Esophageal obstruction: Secondary | ICD-10-CM | POA: Diagnosis not present

## 2016-12-16 ENCOUNTER — Encounter: Payer: Self-pay | Admitting: *Deleted

## 2016-12-17 ENCOUNTER — Encounter: Payer: Self-pay | Admitting: *Deleted

## 2016-12-17 ENCOUNTER — Encounter
Admission: RE | Disposition: A | Discharge status: Home / Self Care | Payer: Self-pay | Source: Ambulatory Visit | Attending: Unknown Physician Specialty

## 2016-12-17 ENCOUNTER — Ambulatory Visit: Payer: Medicare Other | Admitting: Anesthesiology

## 2016-12-17 ENCOUNTER — Ambulatory Visit
Admission: RE | Admit: 2016-12-17 | Discharge: 2016-12-17 | Disposition: A | Discharge status: Home / Self Care | Payer: Medicare Other | Source: Ambulatory Visit | Attending: Unknown Physician Specialty | Admitting: Unknown Physician Specialty

## 2016-12-17 DIAGNOSIS — J449 Chronic obstructive pulmonary disease, unspecified: Secondary | ICD-10-CM | POA: Insufficient documentation

## 2016-12-17 DIAGNOSIS — Z87891 Personal history of nicotine dependence: Secondary | ICD-10-CM | POA: Insufficient documentation

## 2016-12-17 DIAGNOSIS — K2970 Gastritis, unspecified, without bleeding: Secondary | ICD-10-CM | POA: Diagnosis not present

## 2016-12-17 DIAGNOSIS — M1990 Unspecified osteoarthritis, unspecified site: Secondary | ICD-10-CM | POA: Diagnosis not present

## 2016-12-17 DIAGNOSIS — Z8614 Personal history of Methicillin resistant Staphylococcus aureus infection: Secondary | ICD-10-CM | POA: Insufficient documentation

## 2016-12-17 DIAGNOSIS — R1310 Dysphagia, unspecified: Secondary | ICD-10-CM | POA: Insufficient documentation

## 2016-12-17 DIAGNOSIS — I10 Essential (primary) hypertension: Secondary | ICD-10-CM | POA: Insufficient documentation

## 2016-12-17 DIAGNOSIS — E1151 Type 2 diabetes mellitus with diabetic peripheral angiopathy without gangrene: Secondary | ICD-10-CM | POA: Insufficient documentation

## 2016-12-17 DIAGNOSIS — E785 Hyperlipidemia, unspecified: Secondary | ICD-10-CM | POA: Insufficient documentation

## 2016-12-17 DIAGNOSIS — Z7982 Long term (current) use of aspirin: Secondary | ICD-10-CM | POA: Diagnosis not present

## 2016-12-17 DIAGNOSIS — K219 Gastro-esophageal reflux disease without esophagitis: Secondary | ICD-10-CM | POA: Diagnosis not present

## 2016-12-17 DIAGNOSIS — Z8546 Personal history of malignant neoplasm of prostate: Secondary | ICD-10-CM | POA: Insufficient documentation

## 2016-12-17 DIAGNOSIS — Z794 Long term (current) use of insulin: Secondary | ICD-10-CM | POA: Insufficient documentation

## 2016-12-17 DIAGNOSIS — K449 Diaphragmatic hernia without obstruction or gangrene: Secondary | ICD-10-CM | POA: Insufficient documentation

## 2016-12-17 HISTORY — DX: Pneumonia, unspecified organism: J18.9

## 2016-12-17 HISTORY — DX: Gastro-esophageal reflux disease without esophagitis: K21.9

## 2016-12-17 HISTORY — PX: ESOPHAGOGASTRODUODENOSCOPY (EGD) WITH PROPOFOL: SHX5813

## 2016-12-17 HISTORY — DX: Chronic obstructive pulmonary disease, unspecified: J44.9

## 2016-12-17 HISTORY — DX: Carrier or suspected carrier of methicillin resistant Staphylococcus aureus: Z22.322

## 2016-12-17 HISTORY — DX: Unspecified osteoarthritis, unspecified site: M19.90

## 2016-12-17 HISTORY — DX: Anemia, unspecified: D64.9

## 2016-12-17 LAB — GLUCOSE, CAPILLARY: Glucose-Capillary: 101 mg/dL — ABNORMAL HIGH (ref 65–99)

## 2016-12-17 SURGERY — ESOPHAGOGASTRODUODENOSCOPY (EGD) WITH PROPOFOL
Anesthesia: General

## 2016-12-17 MED ORDER — FENTANYL CITRATE (PF) 100 MCG/2ML IJ SOLN
INTRAMUSCULAR | Status: DC | PRN
  Administered 2016-12-17: 50 ug via INTRAVENOUS

## 2016-12-17 MED ORDER — PIPERACILLIN-TAZOBACTAM 3.375 G IVPB 30 MIN
3.3750 g | Freq: Once | INTRAVENOUS | Status: AC
  Administered 2016-12-17: 3.375 g via INTRAVENOUS
  Filled 2016-12-17: qty 50

## 2016-12-17 MED ORDER — SODIUM CHLORIDE 0.9 % IV SOLN: INTRAVENOUS | Status: DC

## 2016-12-17 MED ORDER — LIDOCAINE HCL (PF) 2 % IJ SOLN
INTRAMUSCULAR | Status: AC
  Filled 2016-12-17: qty 2

## 2016-12-17 MED ORDER — MIDAZOLAM HCL 2 MG/2ML IJ SOLN
INTRAMUSCULAR | Status: AC
  Filled 2016-12-17: qty 2

## 2016-12-17 MED ORDER — PROPOFOL 10 MG/ML IV BOLUS
INTRAVENOUS | Status: DC | PRN
  Administered 2016-12-17: 30 mg via INTRAVENOUS
  Administered 2016-12-17: 50 mg via INTRAVENOUS

## 2016-12-17 MED ORDER — FENTANYL CITRATE (PF) 100 MCG/2ML IJ SOLN
INTRAMUSCULAR | Status: AC
  Filled 2016-12-17: qty 2

## 2016-12-17 MED ORDER — SODIUM CHLORIDE 0.9 % IV SOLN
INTRAVENOUS | Status: DC
  Administered 2016-12-17: 08:00:00 via INTRAVENOUS

## 2016-12-17 MED ORDER — MIDAZOLAM HCL 5 MG/5ML IJ SOLN
INTRAMUSCULAR | Status: DC | PRN
  Administered 2016-12-17: 1 mg via INTRAVENOUS

## 2016-12-17 MED ORDER — PROPOFOL 10 MG/ML IV BOLUS
INTRAVENOUS | Status: AC
  Filled 2016-12-17: qty 40

## 2016-12-17 NOTE — Op Note (Signed)
[facility] Gastroenterology Patient Name: Keith Davila Procedure Date: 12/17/2016 8:06 AM MRN: [id number] Account #: [id number] Date of Birth: 04-24-1929 Admit Type: Outpatient Age: 83 Room: [facility] ENDO ROOM 4 Gender: Male Note Status: Finalized Procedure:            Upper GI endoscopy Indications:          Dysphagia Providers:            Manya Silvas, MD Referring MD:         Rusty Aus, MD (Referring MD) Medicines:            Propofol per Anesthesia Complications:        No immediate complications. Procedure:            Pre-Anesthesia Assessment:                       - After reviewing the risks and benefits, the patient                        was deemed in satisfactory condition to undergo the                        procedure.                       After obtaining informed consent, the endoscope was                        passed under direct vision. Throughout the procedure,                        the patient's blood pressure, pulse, and oxygen                        saturations were monitored continuously. The Endoscope                        was introduced through the mouth, and advanced to the                        body of the stomach. The upper GI endoscopy was                        performed with difficulty due to a J-shaped stomach                        which made pyloric intubation difficult. The patient                        tolerated the procedure well. Findings:      A large paraesophageal hernia was found. Esophagus otherwise normal.      Patchy moderate inflammation characterized by erosions was found in the       gastric body. A large piece of food noted in upper stomach.      Despite multiple attempts the scope would not pass into the distal       stomach due to the distortion created by the hernia. Therefore the       antrum and distal body and duodenum could not be visualized. Impression:           -  Large paraesophageal  hernia.                       - Gastritis.                       - No specimens collected. Recommendation:       - The findings and recommendations were discussed with                        the patient's family. Manya Silvas, MD 12/17/2016 8:49:08 AM This report has been signed electronically. Number of Addenda: 0 Note Initiated On: 12/17/2016 8:06 AM      [facility]

## 2016-12-17 NOTE — Addendum Note (Signed)
Addendum  created 12/17/16 0919 by Gunnar Fusi, MD   Charge Capture section accepted

## 2016-12-17 NOTE — Anesthesia Preprocedure Evaluation (Signed)
Anesthesia Evaluation  Patient identified by MRN, date of birth, ID band Patient awake    Reviewed: Allergy & Precautions, NPO status , Patient's Chart, lab work & pertinent test results  History of Anesthesia Complications Negative for: history of anesthetic complications  Airway Mallampati: I       Dental   Pulmonary COPD, former smoker,           Cardiovascular hypertension, Pt. on medications and Pt. on home beta blockers      Neuro/Psych negative neurological ROS     GI/Hepatic GERD  Medicated and Controlled,  Endo/Other  diabetes, Type 2, Oral Hypoglycemic Agents, Insulin Dependent  Renal/GU negative Renal ROS     Musculoskeletal   Abdominal   Peds  Hematology  (+) anemia ,   Anesthesia Other Findings   Reproductive/Obstetrics                            Anesthesia Physical Anesthesia Plan  ASA: III  Anesthesia Plan: General   Post-op Pain Management:    Induction: Intravenous  Airway Management Planned: Nasal Cannula  Additional Equipment:   Intra-op Plan:   Post-operative Plan:   Informed Consent: I have reviewed the patients History and Physical, chart, labs and discussed the procedure including the risks, benefits and alternatives for the proposed anesthesia with the patient or authorized representative who has indicated his/her understanding and acceptance.     Plan Discussed with:   Anesthesia Plan Comments:         Anesthesia Quick Evaluation

## 2016-12-17 NOTE — Anesthesia Post-op Follow-up Note (Signed)
Anesthesia QCDR form completed.        

## 2016-12-17 NOTE — Transfer of Care (Signed)
Immediate Anesthesia Transfer of Care Note  Patient: Keith Davila  Procedure(s) Performed: Procedure(s): ESOPHAGOGASTRODUODENOSCOPY (EGD) WITH PROPOFOL (N/A)  Patient Location: PACU and Endoscopy Unit  Anesthesia Type:General  Level of Consciousness: sedated  Airway & Oxygen Therapy: Patient Spontanous Breathing and Patient connected to nasal cannula oxygen  Post-op Assessment: Report given to RN and Post -op Vital signs reviewed and stable  Post vital signs: stable  Last Vitals:  Vitals:   12/17/16 0756 12/17/16 0846  BP: (!) 195/81 121/81  Pulse: (!) 58 60  Resp: 16 18  Temp: 36.1 C (!) 35.4 C    Last Pain:  Vitals:   12/17/16 0846  TempSrc: Tympanic         Complications: No apparent anesthesia complications

## 2016-12-17 NOTE — H&P (Signed)
Primary Care Physician:  Faye Ramsay, FNP Primary Gastroenterologist:  Dr. Vira Agar  Pre-Procedure History & Physical: HPI:  Keith Davila is a 83 y.o. male is here for an endoscopy.   Past Medical History:  Diagnosis Date  . Anemia   . Arthritis   . Cancer [facility])    prostate  . COPD (chronic obstructive pulmonary disease) ([place])   . Diabetes mellitus without complication ([place])   . GERD (gastroesophageal reflux disease)   . Hyperlipidemia   . Hypertension   . MRSA (methicillin resistant staph aureus) culture positive 2009  . Peripheral vascular disease ([place])   . Pneumonia   . prostate   . Renal disorder   . Varicose veins     Past Surgical History:  Procedure Laterality Date  . BACK SURGERY    . HERNIA REPAIR    . JOINT REPLACEMENT    . SPINE SURGERY      Prior to Admission medications   Medication Sig Start Date End Date Taking? Authorizing Provider  aspirin 81 MG tablet Take 81 mg by mouth daily.   Yes Historical Provider, MD  brimonidine (ALPHAGAN) 0.2 % ophthalmic solution Apply to eye.   Yes Historical Provider, MD  calcium-vitamin D (OSCAL WITH D) 500-200 MG-UNIT per tablet Take 1 tablet by mouth.   Yes Historical Provider, MD  ceditoren (SPECTRACEF) 200 MG tablet Take 200 mg by mouth 2 (two) times daily.   Yes Historical Provider, MD  glipiZIDE (GLUCOTROL) 10 MG tablet Take 10 mg by mouth 2 (two) times daily before a meal.   Yes Historical Provider, MD  hydrochlorothiazide (HYDRODIURIL) 25 MG tablet Take 25 mg by mouth daily.   Yes Historical Provider, MD  latanoprost (XALATAN) 0.005 % ophthalmic solution Apply to eye.   Yes Historical Provider, MD  metoprolol succinate (TOPROL-XL) 50 MG 24 hr tablet Take 50 mg by mouth daily. Take with or immediately following a meal.   Yes Historical Provider, MD  omeprazole (PRILOSEC) 20 MG capsule Take 20 mg by mouth 2 (two) times daily before a meal.   Yes Historical Provider, MD  simvastatin (ZOCOR) 80 MG tablet Take 80  mg by mouth daily.   Yes Historical Provider, MD  sitaGLIPtin (JANUVIA) 50 MG tablet Take 50 mg by mouth daily.   Yes Historical Provider, MD  tamsulosin (FLOMAX) 0.4 MG CAPS capsule Take by mouth.   Yes Historical Provider, MD  timolol (BETIMOL) 0.5 % ophthalmic solution 1 drop 2 (two) times daily.   Yes Historical Provider, MD  tolterodine (DETROL) 1 MG tablet Take 1 mg by mouth daily.   Yes Historical Provider, MD  acetaminophen (TYLENOL) 325 MG tablet Take 2 tablets (650 mg total) by mouth every 6 (six) hours as needed for mild pain (or Fever >/= 101). 05/08/15   Nicholes Mango, MD  docusate sodium (COLACE) 100 MG capsule Take 100 mg by mouth 2 (two) times daily.    Historical Provider, MD  insulin detemir (LEVEMIR) 100 UNIT/ML injection Inject 0.08 mLs (8 Units total) into the skin daily. Patient not taking: Reported on 08/08/2016 05/08/15   Nicholes Mango, MD  metoprolol (LOPRESSOR) 50 MG tablet Take by mouth.    Historical Provider, MD  senna (SENOKOT) 8.6 MG tablet Take 1 tablet by mouth daily.    Historical Provider, MD    Allergies as of 12/16/2016 - Review Complete 12/16/2016  Allergen Reaction Noted  . Prevacid [lansoprazole]  05/06/2015    Family History  Problem Relation Age of Onset  .  Diabetes Other   . Diabetes Mother   . Heart attack Father     Social History   Social History  . Marital status: Widowed    Spouse name: N/A  . Number of children: N/A  . Years of education: N/A   Occupational History  . Not on file.   Social History Main Topics  . Smoking status: Former Research scientist (life sciences)  . Smokeless tobacco: Never Used  . Alcohol use No  . Drug use: No  . Sexual activity: Not on file   Other Topics Concern  . Not on file   Social History Narrative  . No narrative on file    Review of Systems: See HPI, otherwise negative ROS  Physical Exam: BP (!) 195/81   Pulse (!) 58   Temp 97 F (36.1 C) (Tympanic)   Resp 16   Ht 6\' 2"  (1.88 m)   Wt 87.1 kg (192 lb)   SpO2  100%   BMI 24.65 kg/m  General:   Alert,  pleasant and cooperative in NAD Head:  Normocephalic and atraumatic. Neck:  Supple; no masses or thyromegaly. Lungs:  Clear throughout to auscultation.    Heart:  Regular rate and rhythm. Abdomen:  Soft, nontender and nondistended. Normal bowel sounds, without guarding, and without rebound.   Neurologic:  Alert and  oriented x4;  grossly normal neurologically.  Impression/Plan: [name] is here for an endoscopy to be performed for dysphagia  Risks, benefits, limitations, and alternatives regarding  endoscopy have been reviewed with the patient.  Questions have been answered.  All parties agreeable.   Gaylyn Cheers, MD  12/17/2016, 8:15 AM

## 2016-12-17 NOTE — Anesthesia Postprocedure Evaluation (Signed)
Anesthesia Post Note  Patient: Keith Davila  Procedure(s) Performed: Procedure(s) (LRB): ESOPHAGOGASTRODUODENOSCOPY (EGD) WITH PROPOFOL (N/A)  Patient location during evaluation: Endoscopy Anesthesia Type: General Level of consciousness: awake and alert Pain management: pain level controlled Vital Signs Assessment: post-procedure vital signs reviewed and stable Respiratory status: spontaneous breathing and respiratory function stable Cardiovascular status: stable Anesthetic complications: no     Last Vitals:  Vitals:   12/17/16 0756 12/17/16 0846  BP: (!) 195/81 121/81  Pulse: (!) 58 60  Resp: 16 18  Temp: 36.1 C (!) 35.4 C    Last Pain:  Vitals:   12/17/16 0846  TempSrc: Tympanic                 KEPHART,WILLIAM K

## 2016-12-18 ENCOUNTER — Encounter: Payer: Self-pay | Admitting: Unknown Physician Specialty

## 2017-01-20 ENCOUNTER — Telehealth: Payer: Self-pay | Admitting: Cardiovascular Disease

## 2017-01-20 NOTE — Telephone Encounter (Signed)
Received records from [facility] for appointment on 02/11/17 with Dr Sallyanne Kuster.  Records put with Dr Croitoru's schedule for 02/11/17.  lp

## 2017-02-11 ENCOUNTER — Other Ambulatory Visit: Payer: Self-pay

## 2017-02-11 ENCOUNTER — Encounter (HOSPITAL_COMMUNITY)
Admission: RE | Disposition: A | Discharge status: Home / Self Care | Payer: Self-pay | Source: Ambulatory Visit | Attending: Cardiovascular Disease

## 2017-02-11 ENCOUNTER — Ambulatory Visit (HOSPITAL_COMMUNITY)
Admission: RE | Admit: 2017-02-11 | Discharge: 2017-02-11 | Disposition: A | Discharge status: Home / Self Care | Payer: Medicare Other | Source: Ambulatory Visit | Attending: Cardiovascular Disease | Admitting: Cardiovascular Disease

## 2017-02-11 ENCOUNTER — Encounter: Payer: Self-pay | Admitting: Cardiovascular Disease

## 2017-02-11 ENCOUNTER — Ambulatory Visit (INDEPENDENT_AMBULATORY_CARE_PROVIDER_SITE_OTHER): Payer: Medicare Other | Admitting: Cardiovascular Disease

## 2017-02-11 VITALS — BP 146/77 | HR 70

## 2017-02-11 DIAGNOSIS — Z7982 Long term (current) use of aspirin: Secondary | ICD-10-CM | POA: Insufficient documentation

## 2017-02-11 DIAGNOSIS — R55 Syncope and collapse: Secondary | ICD-10-CM | POA: Insufficient documentation

## 2017-02-11 DIAGNOSIS — J449 Chronic obstructive pulmonary disease, unspecified: Secondary | ICD-10-CM | POA: Diagnosis not present

## 2017-02-11 DIAGNOSIS — Z794 Long term (current) use of insulin: Secondary | ICD-10-CM | POA: Diagnosis not present

## 2017-02-11 DIAGNOSIS — I739 Peripheral vascular disease, unspecified: Secondary | ICD-10-CM

## 2017-02-11 DIAGNOSIS — Z87891 Personal history of nicotine dependence: Secondary | ICD-10-CM | POA: Diagnosis not present

## 2017-02-11 DIAGNOSIS — E11649 Type 2 diabetes mellitus with hypoglycemia without coma: Secondary | ICD-10-CM

## 2017-02-11 DIAGNOSIS — K219 Gastro-esophageal reflux disease without esophagitis: Secondary | ICD-10-CM | POA: Diagnosis not present

## 2017-02-11 DIAGNOSIS — I959 Hypotension, unspecified: Secondary | ICD-10-CM | POA: Diagnosis not present

## 2017-02-11 DIAGNOSIS — M1990 Unspecified osteoarthritis, unspecified site: Secondary | ICD-10-CM | POA: Diagnosis not present

## 2017-02-11 DIAGNOSIS — E785 Hyperlipidemia, unspecified: Secondary | ICD-10-CM | POA: Insufficient documentation

## 2017-02-11 DIAGNOSIS — E1151 Type 2 diabetes mellitus with diabetic peripheral angiopathy without gangrene: Secondary | ICD-10-CM | POA: Insufficient documentation

## 2017-02-11 DIAGNOSIS — I10 Essential (primary) hypertension: Secondary | ICD-10-CM | POA: Diagnosis not present

## 2017-02-11 DIAGNOSIS — I350 Nonrheumatic aortic (valve) stenosis: Secondary | ICD-10-CM | POA: Diagnosis not present

## 2017-02-11 DIAGNOSIS — Z8249 Family history of ischemic heart disease and other diseases of the circulatory system: Secondary | ICD-10-CM | POA: Diagnosis not present

## 2017-02-11 HISTORY — PX: TILT TABLE STUDY: EP1226

## 2017-02-11 LAB — GLUCOSE, CAPILLARY: Glucose-Capillary: 155 mg/dL — ABNORMAL HIGH (ref 65–99)

## 2017-02-11 SURGERY — TILT TABLE STUDY

## 2017-02-11 MED ORDER — LOSARTAN POTASSIUM 25 MG PO TABS: 25.0000 mg | ORAL_TABLET | Freq: Every day | ORAL | Status: DC

## 2017-02-11 MED ORDER — DEXTROSE 5 % IV SOLN
INTRAVENOUS | Status: DC
  Administered 2017-02-11: 13:00:00 via INTRAVENOUS

## 2017-02-11 MED ORDER — NITROGLYCERIN 0.4 MG/SPRAY TL SOLN
Status: DC | PRN
  Administered 2017-02-11: 400 mg via SUBLINGUAL

## 2017-02-11 MED ORDER — METOPROLOL TARTRATE 50 MG PO TABS: 25.0000 mg | ORAL_TABLET | Freq: Two times a day (BID) | ORAL | Status: DC

## 2017-02-11 MED ORDER — NITROGLYCERIN 0.4 MG SL SUBL
SUBLINGUAL_TABLET | SUBLINGUAL | Status: AC
  Filled 2017-02-11: qty 1

## 2017-02-11 SURGICAL SUPPLY — 1 items: ELECT DEFIB PAD ADLT CADENCE (PAD) ×2 IMPLANT

## 2017-02-11 NOTE — Patient Instructions (Signed)
Your physician has recommended that you have a tilt table test TODAY at 1:30pm with Dr Sallyanne Kuster. This test is sometimes used to help determine the cause of fainting spells. You lie on a table that moves from a lying down to an upright position. The change in position can bring on loss of consciousness. The doctor monitors your symptoms, heart rate, EKG, and blood pressure throughout the test. The doctor also may give you a medicine and then monitor your response to the medicine. This is done in the hospital and usually takes half of a day to complete the procedure. Please see the instruction sheet given to you today for more information.  Please arrive at the [facility] (Main Entrance A) at [facility]: [address] [place], [place] [address] as soon as possible to ensure your preparation for the procedure. Free valet parking service is available.   Special note: Every effort is made to have your procedure done on time. Please understand that emergencies sometimes delay scheduled procedures.
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
## 2017-02-11 NOTE — Op Note (Addendum)
Procedure performed: Head up tilt table test  Procedure performed by: Sanda Klein, MD, [facility]  Reason for procedure: Recurrent syncope    This procedure has been fully reviewed with the patient and written informed consent has been obtained.  The patient was initially monitored in the supine position until achievement of hemodynamic steady state. At this point the patient's blood pressure was 190/77 mmHg and the patient's heart rate was 50 bpm.  Right and left carotid sinus compression was performed sequentially. The heart rate decreased from 50 beats per minutes to roughly 35 beats per minutes on both occasions. A few junctional escape beats were seen. The patient did not have any symptoms.  The patient was subsequently raised to the 70 head up tilt position. There was not a significant change in hemodynamic parameters immediately following the upright position. There was no evidence of orthostatic hypotension  Continuous hemodynamic monitoring using arm cuff sphygmomanometry and electrocardiography was performed for a total of 22 minutes. During this time the patient did not have any complaints.   Subsequently a single dose of 0.4 mg nitroglycerin was administered in the form of a spray. During continued hemodynamic monitoring there was an expected mild increase in heart rate and gradual decrease in blood pressure.  The blood pressure continued to steadily fold to a minimum recorded blood pressure of 78/43 mmHg. The patient felt dizzy. He subsequently lost consciousness and blood pressure could not be recorded. Throughout this period of time the heart rate remained essentially unchanged at about 66 bpm.  After losing consciousness, the patient was returned to the supine position and monitored until vital signs reached her baseline values.  Conclusion:  Nonspecific head up tilt table test result.  Syncope occurred due to hypotension, but this was likely secondary to vasodilator effects  of nitroglycerin and not a true vasovagal/neurally mediated event.  Treatment recommendations:  - Avoid prolonged orthostasis without moving - Avoid the use of potent vasodilators - Encourage adequate intake of fluids throughout the day - Do not drive until the mechanism of syncope is clearly identified and treated.  This study did not confirm the mechanism of his clinical events. It is still possible that he has syncope due to cardiac arrhythmia. Recommend implantable loop recorder.  Recommend reducing the dose of metoprolol to half the current (25 mg twice daily). Will increase the dose of losartan to compensate.  Sanda Klein, MD, [place] [phone] office [phone] pager

## 2017-02-11 NOTE — Interval H&P Note (Signed)
History and Physical Interval Note:  02/11/2017 1:51 PM  Keith Davila  has presented today for surgery, with the diagnosis of syncope  The various methods of treatment have been discussed with the patient and family. After consideration of risks, benefits and other options for treatment, the patient has consented to  Procedure(s): Tilt Table Study (N/A) as a surgical intervention .  The patient's history has been reviewed, patient examined, no change in status, stable for surgery.  I have reviewed the patient's chart and labs.  Questions were answered to the patient's satisfaction.     [name]
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
## 2017-02-12 ENCOUNTER — Encounter (HOSPITAL_COMMUNITY): Payer: Self-pay | Admitting: Cardiovascular Disease

## 2017-02-12 MED FILL — Nitroglycerin SL Tab 0.4 MG: SUBLINGUAL | Qty: 1 | Status: AC

## 2017-02-13 NOTE — Addendum Note (Signed)
Addended by: Milderd Meager on: 02/13/2017 08:35 AM   Modules accepted: Orders

## 2017-03-10 ENCOUNTER — Encounter: Payer: Self-pay | Admitting: Emergency Medicine

## 2017-03-10 ENCOUNTER — Emergency Department
Admission: EM | Admit: 2017-03-10 | Discharge: 2017-03-10 | Disposition: A | Discharge status: Home / Self Care | Payer: Medicare Other | Attending: Emergency Medicine | Admitting: Emergency Medicine

## 2017-03-10 ENCOUNTER — Emergency Department: Payer: Medicare Other

## 2017-03-10 DIAGNOSIS — I10 Essential (primary) hypertension: Secondary | ICD-10-CM | POA: Diagnosis not present

## 2017-03-10 DIAGNOSIS — Z794 Long term (current) use of insulin: Secondary | ICD-10-CM | POA: Insufficient documentation

## 2017-03-10 DIAGNOSIS — Z8546 Personal history of malignant neoplasm of prostate: Secondary | ICD-10-CM | POA: Insufficient documentation

## 2017-03-10 DIAGNOSIS — Y92009 Unspecified place in unspecified non-institutional (private) residence as the place of occurrence of the external cause: Secondary | ICD-10-CM | POA: Diagnosis not present

## 2017-03-10 DIAGNOSIS — Z79899 Other long term (current) drug therapy: Secondary | ICD-10-CM | POA: Insufficient documentation

## 2017-03-10 DIAGNOSIS — Z96653 Presence of artificial knee joint, bilateral: Secondary | ICD-10-CM | POA: Diagnosis not present

## 2017-03-10 DIAGNOSIS — W19XXXA Unspecified fall, initial encounter: Secondary | ICD-10-CM | POA: Insufficient documentation

## 2017-03-10 DIAGNOSIS — M25561 Pain in right knee: Secondary | ICD-10-CM | POA: Insufficient documentation

## 2017-03-10 DIAGNOSIS — Y939 Activity, unspecified: Secondary | ICD-10-CM | POA: Diagnosis not present

## 2017-03-10 DIAGNOSIS — E119 Type 2 diabetes mellitus without complications: Secondary | ICD-10-CM | POA: Diagnosis not present

## 2017-03-10 DIAGNOSIS — Z7982 Long term (current) use of aspirin: Secondary | ICD-10-CM | POA: Diagnosis not present

## 2017-03-10 DIAGNOSIS — S8991XA Unspecified injury of right lower leg, initial encounter: Secondary | ICD-10-CM | POA: Diagnosis present

## 2017-03-10 DIAGNOSIS — Z87891 Personal history of nicotine dependence: Secondary | ICD-10-CM | POA: Insufficient documentation

## 2017-03-10 DIAGNOSIS — Y999 Unspecified external cause status: Secondary | ICD-10-CM | POA: Insufficient documentation

## 2017-03-10 DIAGNOSIS — J449 Chronic obstructive pulmonary disease, unspecified: Secondary | ICD-10-CM | POA: Diagnosis not present

## 2017-03-10 NOTE — ED Notes (Signed)
NAD noted at time of D/C. Pt taken to the lobby via wheelchair by family member at this time.

## 2017-03-10 NOTE — ED Triage Notes (Signed)
Pt fell last week injuring his right knee. Pt with bilateral knee replacements and wants to make sure he did not mess his knee up. Denies any other sx. Nad.

## 2017-03-10 NOTE — ED Provider Notes (Signed)
[facility] Emergency Department Provider Note  ____________________________________________  Time seen: Approximately 12:01 PM  I have reviewed the triage vital signs and the nursing notes.   HISTORY  Chief Complaint Knee Pain and Fall    HPI Keith Davila is a 82 y.o. male that presents to emergency department with right knee pain for 4 days. Patient states that his right knee gave out on him on Thursday causing him to fall directly onto that knee. He fell again on Saturday which made that knee pain worse. He states that it is his balance and his knees that made him fall. He was recently on a heart monitor for 15 days and sent in the monitor last week but has not received the results. He denies any chest pain or palpitations before these falls. He remembers everything and did not hit his head. He had a right knee replacement in 2000 and a left knee replacement in 2003. He states that his knee surgeon is no longer here. He is here today because he is concerned thathe irritated the replacement. He usually walks with a cane but has been using his walker for the last 2 days. He denies fever, shortness of breath, chest pain, nausea, vomiting, abdominal pain.   Past Medical History:  Diagnosis Date  . Anemia   . Arthritis   . Cancer [facility])    prostate  . COPD (chronic obstructive pulmonary disease) ([place])   . Diabetes mellitus without complication ([place])   . GERD (gastroesophageal reflux disease)   . Hyperlipidemia   . Hypertension   . MRSA (methicillin resistant staph aureus) culture positive 2009  . Peripheral vascular disease ([place])   . Pneumonia   . prostate   . Renal disorder   . Varicose veins     Patient Active Problem List   Diagnosis Date Noted  . Syncope 02/11/2017  . Peripheral vascular disease ([place]) 07/09/2016  . Varicose veins 07/09/2016  . Essential hypertension 07/09/2016  . Hypoglycemia associated with diabetes ([place]) 05/06/2015  .  Anemia associated with chronic renal failure 11/06/2014  . Combined fat and carbohydrate induced hyperlipemia 11/06/2014  . Complication of diabetes mellitus ([place]) 09/27/2014    Past Surgical History:  Procedure Laterality Date  . BACK SURGERY    . ESOPHAGOGASTRODUODENOSCOPY (EGD) WITH PROPOFOL N/A 12/17/2016   Procedure: ESOPHAGOGASTRODUODENOSCOPY (EGD) WITH PROPOFOL;  Surgeon: Manya Silvas, MD;  Location: [facility] ENDOSCOPY;  Service: Endoscopy;  Laterality: N/A;  . HERNIA REPAIR    . JOINT REPLACEMENT    . SPINE SURGERY    . TILT TABLE STUDY N/A 02/11/2017   Procedure: Tilt Table Study;  Surgeon: Sanda Klein, MD;  Location: [facility] CV LAB;  Service: Cardiovascular;  Laterality: N/A;    Prior to Admission medications   Medication Sig Start Date End Date Taking? Authorizing Provider  aspirin 81 MG tablet Take 81 mg by mouth daily.    [provider]  docusate sodium (COLACE) 100 MG capsule Take 100 mg by mouth daily.     [provider]  ferrous fumarate-iron polysaccharide complex (TANDEM) 162-115.2 MG CAPS capsule Take 1 tablet by mouth daily.    [provider]  glipiZIDE (GLUCOTROL) 5 MG tablet Take 5 mg by mouth daily.     [provider]  insulin glargine (LANTUS) 100 unit/mL SOPN Inject 16 Units into the skin every evening.     [provider]  latanoprost (XALATAN) 0.005 % ophthalmic solution Place 1 drop into both eyes at  bedtime.     [provider]  losartan (COZAAR) 25 MG tablet Take 1 tablet (25 mg total) by mouth daily. 02/11/17   Croitoru, Mihai, MD  metoprolol (LOPRESSOR) 50 MG tablet Take 0.5 tablets (25 mg total) by mouth 2 (two) times daily. 02/11/17   Croitoru, Mihai, MD  Multiple Vitamins-Minerals ([employer] SPECTRAVITE SENIOR PO) Take 1 tablet by mouth daily.    [provider]  simvastatin (ZOCOR) 80 MG tablet Take 80 mg by mouth every evening.     [provider]  tamsulosin (FLOMAX) 0.4 MG CAPS  capsule Take 0.4 mg by mouth daily.     [provider]  timolol (BETIMOL) 0.5 % ophthalmic solution Place 1 drop into the right eye 3 (three) times daily.     [provider]    Allergies Prevacid [lansoprazole]  Family History  Problem Relation Age of Onset  . Diabetes Other   . Diabetes Mother   . Heart attack Father     Social History Social History  Substance Use Topics  . Smoking status: Former Research scientist (life sciences)  . Smokeless tobacco: Never Used  . Alcohol use No     Review of Systems  Constitutional: No fever/chills Cardiovascular: No chest pain. Respiratory:  No SOB. Gastrointestinal: No abdominal pain.  No nausea, no vomiting.  Musculoskeletal: Positive for right knee pain. Skin: Negative for rash, abrasions, lacerations, ecchymosis. Neurological: Negative for headaches, numbness or tingling   ____________________________________________   PHYSICAL EXAM:  VITAL SIGNS: ED Triage Vitals  Enc Vitals Group     BP 03/10/17 1001 (!) 152/77     Pulse Rate 03/10/17 1001 65     Resp 03/10/17 1001 18     Temp 03/10/17 1001 97.5 F (36.4 C)     Temp Source 03/10/17 1001 Oral     SpO2 03/10/17 1001 98 %     Weight 03/10/17 1002 186 lb (84.4 kg)     Height --      Head Circumference --      Peak Flow --      Pain Score 03/10/17 1001 7     Pain Loc --      Pain Edu? --      Excl. in [place]? --      Constitutional: Alert and oriented. Well appearing and in no acute distress. Eyes: Conjunctivae are normal. PERRL. EOMI. Head: Atraumatic. ENT:      Ears:      Nose: No congestion/rhinnorhea.      Mouth/Throat: Mucous membranes are moist.  Neck: No stridor.  Cardiovascular: Normal rate, regular rhythm.  Good peripheral circulation. Respiratory: Normal respiratory effort without tachypnea or retractions. Lungs CTAB. Good air entry to the bases with no decreased or absent breath sounds. Musculoskeletal: Full range of motion to all extremities. No gross  deformities appreciated. No tenderness to palpation over right knee. No swelling or bruising. Neurologic:  Normal speech and language. No gross focal neurologic deficits are appreciated.  Skin:  Skin is warm, dry and intact. No rash noted.   ____________________________________________   LABS (all labs ordered are listed, but only abnormal results are displayed)  Labs Reviewed - No data to display ____________________________________________  EKG   ____________________________________________  RADIOLOGY Robinette Haines, personally viewed and evaluated these images (plain radiographs) as part of my medical decision making, as well as reviewing the written report by the radiologist.  Dg Knee 2 Views Right  Result Date: 03/10/2017 CLINICAL DATA:  Fall.  Right knee pain EXAM:  RIGHT KNEE - 1-2 VIEW COMPARISON:  None. FINDINGS: Prior right knee replacement. Multiple well corticated bone densities and irregularity of the superior pole of the patella. No definite joint effusion. No acute fracture, subluxation or dislocation. IMPRESSION: Irregularity of the superior pole of the patella with multiple adjacent well corticated bone fragments. This may be related to prior knee replacement or old injury. No acute fracture. Electronically Signed   By: Rolm Baptise M.D.   On: 03/10/2017 10:39    ____________________________________________    PROCEDURES  Procedure(s) performed:    Procedures    Medications - No data to display   ____________________________________________   INITIAL IMPRESSION / ASSESSMENT AND PLAN / ED COURSE  Pertinent labs & imaging results that were available during my care of the patient were reviewed by me and considered in my medical decision making (see chart for details).  Review of the [place] CSRS was performed in accordance of the [place] prior to dispensing any controlled drugs.   Patient presented to the emergency department after falling last week on right  knee with concern for irritating his knee replacement. Patient states that he fell because his balance is poor and his knees are weak. He remembers everything from event and did not lose consciousness. X-ray negative for acute bony abnormalities. X-ray indicates possible previous injury. Education about falls was provided and patient was encouraged to continue using walker. Patient is to follow up with orthopedics as directed. Patient is given ED precautions to return to the ED for any worsening or new symptoms.     ____________________________________________  FINAL CLINICAL IMPRESSION(S) / ED DIAGNOSES  Final diagnoses:  History of total bilateral knee replacement  Fall in home, initial encounter      NEW MEDICATIONS STARTED DURING THIS VISIT:  Discharge Medication List as of 03/10/2017 12:07 PM          This chart was dictated using voice recognition software/Dragon. Despite best efforts to proofread, errors can occur which can change the meaning. Any change was purely unintentional.    Laban Emperor, PA-C 03/10/17 1601    Earleen Newport, MD 03/11/17 [phone]

## 2017-03-30 ENCOUNTER — Ambulatory Visit: Payer: Medicare Other | Attending: Orthopedic Surgery | Admitting: Physical Therapy

## 2017-03-30 DIAGNOSIS — M6281 Muscle weakness (generalized): Secondary | ICD-10-CM

## 2017-03-30 DIAGNOSIS — R262 Difficulty in walking, not elsewhere classified: Secondary | ICD-10-CM | POA: Diagnosis present

## 2017-03-30 DIAGNOSIS — G8929 Other chronic pain: Secondary | ICD-10-CM | POA: Diagnosis present

## 2017-03-30 DIAGNOSIS — M25561 Pain in right knee: Secondary | ICD-10-CM | POA: Diagnosis not present

## 2017-03-30 NOTE — Patient Instructions (Signed)
133/73 - BP  HR- 69   Hip flexion L < 3/5  R 4-/5  Knee extension - 5/5 bilaterally Knee flexion 4/5 on LLE, 4+/5 on RLE   Gait observation -   5x sit to stand - 49.06 seconds with significant use of UEs   Palpation -- edema and tenderness over medial joint line   TUG-   60m walk - 22.30 seconds with RW   L knee actively in supine SLR- 20 degrees (pain in anterior hip)  0-85 flexion   R knee Flexion 0-128 SLR to 55 degrees   TUG - 39" with RW  TherEx Supine bridging Sidelying clamshells with yellow t-band

## 2017-03-30 NOTE — Therapy (Signed)
[facility] [address] [place], [address] Phone: [phone]   Fax:  [phone]  Physical Therapy Evaluation  Patient Details  Name: Keith Davila MRN: [id number] Date of Birth: May 11, 1929 No Data Recorded  Encounter Date: 03/30/2017      PT End of Session - 03/30/17 1039    Visit Number 1   Number of Visits 25   Date for PT Re-Evaluation 06/22/17   PT Start Time 0930   PT Stop Time 1033   PT Time Calculation (min) 63 min   Equipment Utilized During Treatment Gait belt   Activity Tolerance Patient tolerated treatment well   Behavior During Therapy WFL for tasks assessed/performed      Past Medical History:  Diagnosis Date  . Anemia   . Arthritis   . Cancer [facility])    prostate  . COPD (chronic obstructive pulmonary disease) ([place])   . Diabetes mellitus without complication ([place])   . GERD (gastroesophageal reflux disease)   . Hyperlipidemia   . Hypertension   . MRSA (methicillin resistant staph aureus) culture positive 2009  . Peripheral vascular disease ([place])   . Pneumonia   . prostate   . Renal disorder   . Varicose veins     Past Surgical History:  Procedure Laterality Date  . BACK SURGERY    . ESOPHAGOGASTRODUODENOSCOPY (EGD) WITH PROPOFOL N/A 12/17/2016   Procedure: ESOPHAGOGASTRODUODENOSCOPY (EGD) WITH PROPOFOL;  Surgeon: Manya Silvas, MD;  Location: [facility] ENDOSCOPY;  Service: Endoscopy;  Laterality: N/A;  . HERNIA REPAIR    . JOINT REPLACEMENT    . SPINE SURGERY    . TILT TABLE STUDY N/A 02/11/2017   Procedure: Tilt Table Study;  Surgeon: Sanda Klein, MD;  Location: [place] CV LAB;  Service: Cardiovascular;  Laterality: N/A;    There were no vitals filed for this visit.       Subjective Assessment - 03/30/17 0936    Subjective Patient reports he lives alone during the day, his grand-daughter works in [place] and lives at home at night. He reports he had 2 falls  3 weeks ago, has a history of bilateral knee replacements. He was changing shirts, fell on his R knee, 2 days later his R knee went out on him and he fell on it again and noticed a lot of pain and swelling. Reports he went to the ED, no acute abnormalities noted. He uses a cane in the house, but he now uses a 4WW all the time, he also has a 2WW. More pain in R knee, L knee he's had problems with since his replacement roughly 15 years ago (strength deficits), had a back operation in 2009 and thinks he may have had some issues with sensation on L side (and strength per patient).    Pertinent History He does endorse a 26# weight loss in the last year without dieting, reports his diet is normal. He does not endorse pain anywhere other than his R knee. He has a history of prostate cancer. He does have some urinary incontinence but this is controlled by medications and his MDs are aware.    Limitations Sitting;Lifting;Standing;Walking;House hold activities   Diagnostic tests X-rays, see chart review.    Patient Stated Goals To reduce his knee pain and reduce falls risk.    Currently in Pain? Yes   Pain Score --  Reports just a little bit in his right knee      133/73 - BP  HR- 69   Hip flexion L < 3/5  R 4-/5  Knee extension - 5/5 bilaterally Knee flexion 4/5 on LLE, 4+/5 on RLE   Gait observation - notable for foot drag on LLE, excessive knee and hip flexion bilaterally, poor control of LLE throughout gait cycle (with RW), variable foot placement and stride length.  5x sit to stand - 49.06 seconds with significant use of UEs   Palpation -- edema and tenderness over medial joint line   Circumfrence  R - 41.9cm at joint line L - 39.8 cm at joint line.   41m walk - 22.30 seconds with RW   L knee actively in supine SLR- 20 degrees (pain in anterior hip)  0-85 flexion   R knee Flexion 0-128 SLR to 55 degrees   TUG - 39" with RW  TherEx Supine bridging -- x 8 repetitions with PT  holding feet in place to reduce superior translation on the table  Sidelying clamshells with yellow t-band around his knees with PT guiding and cuing motion x 10 per side for 2 sets (R side noticeably stronger and performs larger ROM without compensations relative to LLE)            Objective measurements completed on examination: See above findings.                  PT Education - 03/30/17 1039    Education provided Yes   Education Details Patient is to use RW at all times given imminent falls risk, provided HEP.    Person(s) Educated Patient   Methods Explanation;Demonstration;Handout   Comprehension Verbalized understanding;Returned demonstration;Need further instruction          PT Short Term Goals - 03/30/17 1042      PT SHORT TERM GOAL #1   Title Patient will report he uses RW exclusively for assistance with mobility.    Time 1   Period Weeks   Status New           PT Long Term Goals - 03/30/17 1040      PT LONG TERM GOAL #1   Title Patient will demonstrate TUG of less than 25" with RW to demonstrate reduced falls risk.    Time 12   Period Weeks   Status New     PT LONG TERM GOAL #2   Title Patient will demonstrate 5x sit to stand with use of UEs in less than 18" to demonstrate improved LE strength and power for reduced falls risk.    Time 12   Period Weeks   Status New     PT LONG TERM GOAL #3   Title Patient will ambulate 49m in less than 18" to demonstrate improved gait speed and reduced falls risk.    Time 12   Period Weeks   Status New     PT LONG TERM GOAL #4   Title Patient will report LEFS score of greater than 45/80 to demonstrate improved tolerance for ADLs.    Baseline 39/80   Time 12   Period Weeks   Status New                Plan - 03/30/17 1043    Clinical Impression Statement Patient presents with multiple falls recently and profound LE weakness. It appears he has L sided weakness residual to back operation  some time ago, and R sided weakness due to sarcopenia and pain. He is at severe risk of falling, and was educated to  exclusively use RW as his LE strength and gait pattern indicate he is at imminent risk of falling with SPC. He does have swelling around medial R knee, though he reports pain is now mild. He would certainly benefit from dedicated bout of strengthening on LEs to reduce falls risk and R knee pain.    Clinical Presentation Stable   Clinical Decision Making Moderate   Rehab Potential Fair   PT Frequency 2x / week   PT Duration 12 weeks   PT Treatment/Interventions Aquatic Therapy;Moist Heat;Cryotherapy;Lobbyist;Therapeutic exercise;Therapeutic activities;Patient/family education;Neuromuscular re-education;Stair training;Gait training;Ultrasound;Manual techniques;Taping   PT Next Visit Plan Use Russian stim on quads with standing level exercises and machines as tolerated    PT Home Exercise Plan Sit to stands with use of UEs, bridging, clamshells with yellow t-band    Consulted and Agree with Plan of Care Patient      Patient will benefit from skilled therapeutic intervention in order to improve the following deficits and impairments:  Abnormal gait, Decreased knowledge of use of DME, Pain, Decreased activity tolerance, Decreased endurance, Decreased strength, Difficulty walking, Increased edema, Decreased balance, Decreased safety awareness  Visit Diagnosis: Chronic pain of right knee - Plan: PT plan of care cert/re-cert  Difficulty in walking, not elsewhere classified - Plan: PT plan of care cert/re-cert  Muscle weakness (generalized) - Plan: PT plan of care cert/re-cert      G-Codes - 35/46/56 1050    Functional Assessment Tool Used (Outpatient Only) 10m walk, TUG, 5x sit to stand, LEFS   Functional Limitation Mobility: Walking and moving around   Mobility: Walking and Moving Around Current Status (C1275) At least 60 percent but less than 80  percent impaired, limited or restricted   Mobility: Walking and Moving Around Goal Status [phone]) At least 20 percent but less than 40 percent impaired, limited or restricted       Problem List Patient Active Problem List   Diagnosis Date Noted  . Syncope 02/11/2017  . Peripheral vascular disease ([place]) 07/09/2016  . Varicose veins 07/09/2016  . Essential hypertension 07/09/2016  . Hypoglycemia associated with diabetes ([place]) 05/06/2015  . Anemia associated with chronic renal failure 11/06/2014  . Combined fat and carbohydrate induced hyperlipemia 11/06/2014  . Complication of diabetes mellitus ([place]) 09/27/2014    Royce Macadamia PT, DPT, CSCS    03/30/2017, 10:52 AM  [facility] [address], [place], [address] Phone: [phone]   Fax:  [phone]  Name: Domenick Quebedeaux MRN: [id number] Date of Birth: 16-Apr-1929

## 2017-04-02 ENCOUNTER — Ambulatory Visit: Payer: Medicare Other | Admitting: Physical Therapy

## 2017-04-02 DIAGNOSIS — G8929 Other chronic pain: Secondary | ICD-10-CM

## 2017-04-02 DIAGNOSIS — M6281 Muscle weakness (generalized): Secondary | ICD-10-CM

## 2017-04-02 DIAGNOSIS — M25561 Pain in right knee: Principal | ICD-10-CM

## 2017-04-02 DIAGNOSIS — R262 Difficulty in walking, not elsewhere classified: Secondary | ICD-10-CM

## 2017-04-02 NOTE — Patient Instructions (Signed)
LAQ with [place] stim at 73mA  Sit to stands with mod A from raised mat table   Supine bridging x 12 for 2 sets   Side stepping at TM

## 2017-04-02 NOTE — Therapy (Signed)
[facility] [facility] [address], [place], [address] Phone: [phone]   Fax:  [phone]  Physical Therapy Treatment  Patient Details  Name: Keith Davila MRN: [id number] Date of Birth: [birth year] No Data Recorded  Encounter Date: 04/02/2017      PT End of Session - 04/02/17 0942    Visit Number 2   Number of Visits 25   Date for PT Re-Evaluation 06/22/17   PT Start Time 0907   PT Stop Time 0947   PT Time Calculation (min) 40 min   Equipment Utilized During Treatment Gait belt   Activity Tolerance Patient tolerated treatment well   Behavior During Therapy [facility] for tasks assessed/performed      Past Medical History:  Diagnosis Date  . Anemia   . Arthritis   . Cancer [facility])    prostate  . COPD (chronic obstructive pulmonary disease) ([place])   . Diabetes mellitus without complication ([place])   . GERD (gastroesophageal reflux disease)   . Hyperlipidemia   . Hypertension   . MRSA (methicillin resistant staph aureus) culture positive 2009  . Peripheral vascular disease ([place])   . Pneumonia   . prostate   . Renal disorder   . Varicose veins     Past Surgical History:  Procedure Laterality Date  . BACK SURGERY    . ESOPHAGOGASTRODUODENOSCOPY (EGD) WITH PROPOFOL N/A 12/17/2016   Procedure: ESOPHAGOGASTRODUODENOSCOPY (EGD) WITH PROPOFOL;  Surgeon: Manya Silvas, MD;  [facility]: [facility] END[facility]COPY;  Service: Endoscopy;  Laterality: N/A;  . HERNIA REPAIR    . JOINT REPLACEMENT    . SPINE SURGERY    . TILT TABLE STUDY N/A 02/11/2017   Procedure: Tilt Table Study;  Surgeon: Sanda Klein, MD;  Location: [facility];  Service: Cardiovascular;  Laterality: N/A;    There were no vitals filed for this visit.      Subjective Assessment - 04/02/17 0943    Subjective Patient reports his R knee is feeling a bit better, but his L knee is a bit achey today. He has been doing his HEP and has no complaints.  Denies any new falls.    Pertinent History He does endorse a 26# weight loss in the last year without dieting, reports his diet is normal. He does not endorse pain anywhere other than his R knee. He has a history of prostate cancer. He does have some urinary incontinence but this is controlled by medications and his MDs are aware.    Limitations Sitting;Lifting;Standing;Walking;House hold activities   Diagnostic tests X-rays, see chart review.    Patient Stated Goals To reduce his knee pain and reduce falls risk.    Currently in Pain? Yes   Pain Score --  He does not rate soreness but appears mild to moderate   Pain Location Knee   Pain Orientation Left   Pain Descriptors / Indicators Aching   Pain Type Chronic pain   Pain Onset More than a month ago   Pain Frequency Intermittent   Aggravating Factors  Increased weightbearing      LAQ with Russian stim at 66mA bilaterally on quadriceps mid belly x 15 repetitions x3 sets with 5# bilaterally   Sit to stands with mod A from raised mat table x 4 for 3 sets with [place] stim applied to bilateral quadriceps as above   Supine bridging x 12 for 2 sets (improved ROM though still difficult for him to control eccentric portion)  Side stepping at TM x 2 laps x 2 sets with cuing for more upright posture for increased knee extension -- challenging but well tolerated   Standing hip abduction toe taps with yellow t-band through half of anticipated ROM due to weakness x 6 for 2 sets bilaterally                             PT Education - 04/02/17 0942    Education provided Yes   Education Details Will contact referring MD about potential HHPT referral.    Person(s) Educated Patient   Methods Explanation;Demonstration   Comprehension Verbalized understanding;Returned demonstration          PT Short Term Goals - 03/30/17 1042      PT SHORT TERM GOAL #1   Title Patient will report he uses RW exclusively for assistance  with mobility.    Time 1   Period Weeks   Status New           PT Long Term Goals - 03/30/17 1040      PT LONG TERM GOAL #1   Title Patient will demonstrate TUG of less than 25" with RW to demonstrate reduced falls risk.    Time 12   Period Weeks   Status New     PT LONG TERM GOAL #2   Title Patient will demonstrate 5x sit to stand with use of UEs in less than 18" to demonstrate improved LE strength and power for reduced falls risk.    Time 12   Period Weeks   Status New     PT LONG TERM GOAL #3   Title Patient will ambulate 8m in less than 18" to demonstrate improved gait speed and reduced falls risk.    Time 12   Period Weeks   Status New     PT LONG TERM GOAL #4   Title Patient will report LEFS score of greater than 45/80 to demonstrate improved tolerance for ADLs.    Baseline 39/80   Time 12   Period Weeks   Status New               Plan - 04/02/17 0942    Clinical Impression Statement Patient is able to complete all exercises, however he has severe quadriceps weakness and is at imminent falls risk and requires mod A to perform sit to stands from elevated surface. He is likely having residual knee pain from trauma of fall and patellofemoral compression due to quad weakness and knee flexion constantly in standing. He may benefit from home health PT at this time until he gets stronger as PT is having to assist patient get into vehicle after sessions and he is generally unsafe for mobility without supervision currently.    Clinical Presentation Stable   Clinical Decision Making Moderate   Rehab Potential Fair   PT Frequency 2x / week   PT Duration 12 weeks   PT Treatment/Interventions Aquatic Therapy;Moist Heat;Cryotherapy;Lobbyist;Therapeutic exercise;Therapeutic activities;Patient/family education;Neuromuscular re-education;Stair training;Gait training;Ultrasound;Manual techniques;Taping   PT Next Visit Plan Use Russian stim on  quads with standing level exercises and machines as tolerated    PT Home Exercise Plan Sit to stands with use of UEs, bridging, clamshells with yellow t-band    Consulted and Agree with Plan of Care Patient      Patient will benefit from skilled therapeutic intervention in order to improve the following deficits and impairments:  Abnormal gait,  Decreased knowledge of use of DME, Pain, Decreased activity tolerance, Decreased endurance, Decreased strength, Difficulty walking, Increased edema, Decreased balance, Decreased safety awareness  Visit Diagnosis: Chronic pain of right knee  Difficulty in walking, not elsewhere classified  Muscle weakness (generalized)     Problem List Patient Active Problem List   Diagnosis Date Noted  . Syncope 02/11/2017  . Peripheral vascular disease ([place]) 07/09/2016  . Varicose veins 07/09/2016  . Essential hypertension 07/09/2016  . Hypoglycemia associated with diabetes ([place]) 05/06/2015  . Anemia associated with chronic renal failure 11/06/2014  . Combined fat and carbohydrate induced hyperlipemia 11/06/2014  . Complication of diabetes mellitus ([place]) 09/27/2014   Royce Macadamia PT, DPT, CSCS    04/02/2017, 10:17 AM  [place] [facility] [address], [place], [address] Phone: [phone]   Fax:  [phone]  Name: Keith Davila MRN: [id number] Date of Birth: June 10, 1929

## 2017-04-07 ENCOUNTER — Ambulatory Visit: Payer: Medicare Other | Admitting: Physical Therapy

## 2017-04-09 ENCOUNTER — Ambulatory Visit: Payer: Medicare Other | Admitting: Physical Therapy

## 2017-04-13 ENCOUNTER — Ambulatory Visit: Payer: Medicare Other | Admitting: Physical Therapy

## 2017-04-17 ENCOUNTER — Ambulatory Visit: Payer: Medicare Other | Admitting: Physical Therapy

## 2017-04-20 ENCOUNTER — Ambulatory Visit: Payer: Medicare Other | Admitting: Physical Therapy

## 2017-04-23 ENCOUNTER — Ambulatory Visit: Payer: Medicare Other | Admitting: Physical Therapy

## 2017-04-27 ENCOUNTER — Ambulatory Visit: Payer: Medicare Other | Admitting: Physical Therapy

## 2017-04-30 ENCOUNTER — Ambulatory Visit: Payer: Medicare Other | Admitting: Physical Therapy

## 2017-05-04 ENCOUNTER — Ambulatory Visit: Payer: Medicare Other | Admitting: Physical Therapy

## 2017-05-07 ENCOUNTER — Encounter: Payer: Medicare Other | Admitting: Physical Therapy

## 2017-05-12 ENCOUNTER — Encounter: Payer: Medicare Other | Admitting: Physical Therapy

## 2017-05-14 ENCOUNTER — Encounter: Payer: Medicare Other | Admitting: Physical Therapy

## 2017-05-19 ENCOUNTER — Encounter: Payer: Medicare Other | Admitting: Physical Therapy

## 2017-05-21 ENCOUNTER — Encounter: Payer: Medicare Other | Admitting: Physical Therapy

## 2017-05-26 ENCOUNTER — Encounter: Payer: Medicare Other | Admitting: Physical Therapy

## 2017-05-28 ENCOUNTER — Encounter: Payer: Medicare Other | Admitting: Physical Therapy

## 2017-06-02 ENCOUNTER — Encounter: Payer: Medicare Other | Admitting: Physical Therapy

## 2017-06-04 ENCOUNTER — Encounter: Payer: Medicare Other | Admitting: Physical Therapy

## 2017-09-30 ENCOUNTER — Encounter: Payer: Self-pay | Admitting: Urology

## 2017-09-30 ENCOUNTER — Ambulatory Visit: Payer: Medicare Other | Admitting: Urology

## 2017-09-30 VITALS — BP 152/82 | HR 84 | Ht 72.0 in | Wt 173.0 lb

## 2017-09-30 DIAGNOSIS — Z8546 Personal history of malignant neoplasm of prostate: Secondary | ICD-10-CM

## 2017-09-30 DIAGNOSIS — R350 Frequency of micturition: Secondary | ICD-10-CM | POA: Diagnosis not present

## 2017-09-30 DIAGNOSIS — R32 Unspecified urinary incontinence: Secondary | ICD-10-CM | POA: Diagnosis not present

## 2017-09-30 DIAGNOSIS — R339 Retention of urine, unspecified: Secondary | ICD-10-CM

## 2017-09-30 DIAGNOSIS — N3941 Urge incontinence: Secondary | ICD-10-CM | POA: Diagnosis not present

## 2017-09-30 LAB — BLADDER SCAN AMB NON-IMAGING

## 2017-09-30 MED ORDER — MIRABEGRON ER 25 MG PO TB24: 25.0000 mg | ORAL_TABLET | Freq: Every day | ORAL | 0 refills | Status: DC

## 2017-09-30 NOTE — Progress Notes (Signed)
09/30/2017 1:07 PM   Keith Davila Feb 10, 1929 [id number]  Referring provider: Rusty Aus, MD [facility] [place], [place] [address]  Chief Complaint  Patient presents with  . Prostate Cancer    follow up    HPI: Keith Davila is an 83 year old male who presents today for annual follow-up.  I have followed him for several years for prostate cancer and lower urinary tract symptoms.  I last saw him at [facility] in December 2017.  He was diagnosed with Gleason 4+4 adenocarcinoma prostate in 2005 with a PSA at the time of diagnosis of 4.3.  He was treated with combined IMRT/brachytherapy.  His last PSA at [facility] January 2018 was 0.14.  He has chronic urinary frequency, urgency with urge incontinence and incomplete bladder emptying.  His residuals are less than 200 mL.  He has been on tamsulosin and tolterodine in the past without significant improvement in his symptoms.  He is presently on tamsulosin.  He wears pull-ups and pads for his incontinence.  He denies dysuria or gross hematuria.  He has no flank, abdominal, pelvic or scrotal pain.   PMH: Past Medical History:  Diagnosis Date  . Anemia   . Arthritis   . Cancer [facility])    prostate  . COPD (chronic obstructive pulmonary disease) ([place])   . Diabetes mellitus without complication ([place])   . GERD (gastroesophageal reflux disease)   . Hyperlipidemia   . Hypertension   . MRSA (methicillin resistant staph aureus) culture positive 2009  . Peripheral vascular disease ([place])   . Pneumonia   . prostate   . Renal disorder   . Varicose veins     Surgical History: Past Surgical History:  Procedure Laterality Date  . BACK SURGERY    . ESOPHAGOGASTRODUODENOSCOPY (EGD) WITH PROPOFOL N/A 12/17/2016   Procedure: ESOPHAGOGASTRODUODENOSCOPY (EGD) WITH PROPOFOL;  Surgeon: Manya Silvas, MD;  [facility] ENDOSCOPY;  Service: Endoscopy;  Laterality: N/A;  . HERNIA REPAIR    . JOINT  REPLACEMENT    . SPINE SURGERY    . TILT TABLE STUDY N/A 02/11/2017   Procedure: Tilt Table Study;  Surgeon: Sanda Klein, MD;  Location: [place];  Service: Cardiovascular;  Laterality: N/A;    Home Medications:  Allergies as of 09/30/2017      Reactions   Prevacid [lansoprazole]       Medication List        Accurate as of 09/30/17  1:07 PM. Always use your most recent med list.          aspirin 81 MG tablet Take 81 mg by mouth daily.   [employer] SPECTRAVITE SENIOR PO Take 1 tablet by mouth daily.   docusate sodium 100 MG capsule Commonly known as:  COLACE Take 100 mg by mouth daily.   glipiZIDE 5 MG tablet Commonly known as:  GLUCOTROL Take 5 mg by mouth daily.   insulin glargine 100 unit/mL Sopn Commonly known as:  LANTUS Inject 16 Units into the skin every evening.   latanoprost 0.005 % ophthalmic solution Commonly known as:  XALATAN Place 1 drop into both eyes at bedtime.   losartan 25 MG tablet Commonly known as:  COZAAR Take 1 tablet (25 mg total) by mouth daily.   metoprolol tartrate 50 MG tablet Commonly known as:  LOPRESSOR Take 0.5 tablets (25 mg total) by mouth 2 (two) times daily.   simvastatin 80 MG tablet Commonly known as:  ZOCOR Take 80 mg by  mouth every evening.   tamsulosin 0.4 MG Caps capsule Commonly known as:  FLOMAX Take 0.4 mg by mouth daily.   TANDEM 162-115.2 MG Caps capsule Generic drug:  ferrous fumarate-iron polysaccharide complex Take 1 tablet by mouth daily.   timolol 0.5 % ophthalmic solution Commonly known as:  BETIMOL Place 1 drop into the right eye 3 (three) times daily.       Allergies:  Allergies  Allergen Reactions  . Prevacid [Lansoprazole]     Family History: Family History  Problem Relation Age of Onset  . Diabetes Other   . Diabetes Mother   . Heart attack Father     Social History:  reports that he has quit smoking. he has never used smokeless tobacco. He reports that he does not drink  alcohol or use drugs.  ROS: UROLOGY Frequent Urination?: Yes Hard to postpone urination?: No Burning/pain with urination?: No Get up at night to urinate?: Yes Leakage of urine?: Yes Urine stream starts and stops?: Yes Trouble starting stream?: No Do you have to strain to urinate?: Yes Blood in urine?: No Urinary tract infection?: No Sexually transmitted disease?: No Injury to kidneys or bladder?: No Painful intercourse?: No Weak stream?: No Erection problems?: No Penile pain?: No  Gastrointestinal Nausea?: No Vomiting?: No Indigestion/heartburn?: Yes Diarrhea?: No Constipation?: Yes  Constitutional Fever: No Night sweats?: No Weight loss?: Yes Fatigue?: Yes  Skin Skin rash/lesions?: No Itching?: No  Eyes Blurred vision?: Yes Double vision?: No  Ears/Nose/Throat Sore throat?: No Sinus problems?: No  Hematologic/Lymphatic Swollen glands?: No Easy bruising?: No  Cardiovascular Leg swelling?: Yes Chest pain?: No  Respiratory Cough?: No Shortness of breath?: No  Endocrine Excessive thirst?: No  Musculoskeletal Back pain?: No Joint pain?: Yes  Neurological Headaches?: Yes Dizziness?: Yes  Psychologic Depression?: No Anxiety?: No  Physical Exam: BP (!) 152/82   Pulse 84   Ht 6' (1.829 m)   Wt 173 lb (78.5 kg)   BMI 23.46 kg/m   Constitutional:  Alert and oriented, No acute distress. HEENT: [place] AT, moist mucus membranes.  Trachea midline, no masses. Cardiovascular: No clubbing, cyanosis, or edema. Respiratory: Normal respiratory effort, no increased work of breathing. GI: Abdomen is soft, nontender, nondistended, no abdominal masses GU: No CVA tenderness. Skin: No rashes, bruises or suspicious lesions. Lymph: No cervical or inguinal adenopathy. Neurologic: Grossly intact, no focal deficits, moving all 4 extremities. Psychiatric: Normal mood and affect.  Laboratory Data: Lab Results  Component Value Date   WBC 7.4 05/06/2015   HGB  12.1 (L) 05/06/2015   HCT 37.5 (L) 05/06/2015   MCV 91.6 05/06/2015   PLT 223 05/06/2015    Lab Results  Component Value Date   CREATININE 1.35 (H) 05/07/2015    Lab Results  Component Value Date   HGBA1C 7.3 (H) 05/08/2015    Assessment & Plan:    1. Urge incontinence of urine Stable.  He was interested in a trial of Myrbetriq and was given samples 25 mg daily.  If effective he will call back for an Rx or dose titration  2. Urinary frequency As above  3. Incomplete bladder emptying Stable.  Estimated bladder volume by bladder scan was 140 mL  - BLADDER SCAN AMB NON-IMAGING  4. Personal history of prostate cancer PSA has remained low and stable 13 years out from treatment.  He is scheduled for a PSA next month at [facility]  Return in about 1 year (around 09/30/2018) for Recheck.   Abbie Sons, MD  [place] [address] [place], [place] [address] [phone]

## 2017-12-22 ENCOUNTER — Encounter
Admission: RE | Admit: 2017-12-22 | Discharge: 2017-12-22 | Disposition: A | Discharge status: Home / Self Care | Payer: Medicare Other | Source: Ambulatory Visit | Attending: Internal Medicine | Admitting: Internal Medicine

## 2018-01-09 ENCOUNTER — Non-Acute Institutional Stay (SKILLED_NURSING_FACILITY): Payer: Medicare Other | Admitting: Gerontology

## 2018-01-09 DIAGNOSIS — J189 Pneumonia, unspecified organism: Secondary | ICD-10-CM

## 2018-01-09 DIAGNOSIS — R531 Weakness: Secondary | ICD-10-CM

## 2018-01-09 NOTE — Progress Notes (Signed)
Location:      Place of Service:  Nursing [phone])  Provider: Toni Arthurs, NP-C  PCP: Rusty Aus, MD Patient Care Team: Rusty Aus, MD as PCP - General (Internal Medicine) Desiree Hane, [place] (Physician Assistant)  Extended Emergency Contact Information Primary Emergency Contact: Kamp,Stacey R Address: [address]          [place], [place] [address] Johnnette Litter of [place] Phone: [phone] Work Phone: [phone] Mobile Phone: [phone] Relation: Daughter Secondary Emergency Contact: Veto Kemps [place] Phone: [phone] Relation: Daughter  Code Status: DNR Goals of care:  Advanced Directive information Advanced Directives 03/10/2017  Does Patient Have a Medical Advance Directive? Yes  Type of Advance Directive [place]  Does patient want to make changes to medical advance directive? -  Copy of [place] in Chart? Yes     Allergies  Allergen Reactions  . Prevacid [Lansoprazole]     Chief Complaint  Patient presents with  . Discharge Note    HPI:  84 y.o. male seen today for discharge evaluation. Pt was admitted to the facility for rehab following hospitalization at the [facility] for community acquired pneumonia with generalized weakness and deconditioning. Pt reports his balance has been "off" as he has a h/p B- Knee replacements. Pt has been working with PT and OT.. Pt has been progressing well. Pt completed the course of PO Levaquin for the PNA. Pt has not been having a cough or congestion. Pt denies chest pain or shortness of breath. Pt reports he is feeling better and ready to go home. He reports his appetite is good, he's voiding well and having regular BMs. VSS. No other complaints.      Past Medical History:  Diagnosis Date  . Anemia   . Arthritis   . Cancer [facility])    prostate  . COPD (chronic obstructive pulmonary disease) ([place])   . Diabetes mellitus without complication  ([place])   . GERD (gastroesophageal reflux disease)   . Hyperlipidemia   . Hypertension   . MRSA (methicillin resistant staph aureus) culture positive 2009  . Peripheral vascular disease ([place])   . Pneumonia   . prostate   . Renal disorder   . Varicose veins     Past Surgical History:  Procedure Laterality Date  . BACK SURGERY    . ESOPHAGOGASTRODUODENOSCOPY (EGD) WITH PROPOFOL N/A 12/17/2016   Procedure: ESOPHAGOGASTRODUODENOSCOPY (EGD) WITH PROPOFOL;  Surgeon: Manya Silvas, MD;  Location: [facility] ENDOSCOPY;  Service: Endoscopy;  Laterality: N/A;  . HERNIA REPAIR    . JOINT REPLACEMENT    . SPINE SURGERY    . TILT TABLE STUDY N/A 02/11/2017   Procedure: Tilt Table Study;  Surgeon: Sanda Klein, MD;  Location: [place] CV LAB;  Service: Cardiovascular;  Laterality: N/A;      reports that he has quit smoking. He has never used smokeless tobacco. He reports that he does not drink alcohol or use drugs. Social History   Socioeconomic History  . Marital status: Widowed    Spouse name: Not on file  . Number of children: Not on file  . Years of education: Not on file  . Highest education level: Not on file  Occupational History  . Not on file  Social Needs  . Financial resource strain: Not on file  . Food insecurity:    Worry: Not on file    Inability: Not on file  . Transportation needs:    Medical:  Not on file    Non-medical: Not on file  Tobacco Use  . Smoking status: Former Research scientist (life sciences)  . Smokeless tobacco: Never Used  Substance and Sexual Activity  . Alcohol use: No  . Drug use: No  . Sexual activity: Not on file  Lifestyle  . Physical activity:    Days per week: Not on file    Minutes per session: Not on file  . Stress: Not on file  Relationships  . Social connections:    Talks on phone: Not on file    Gets together: Not on file    Attends religious service: Not on file    Active member of club or organization: Not on file    Attends meetings of clubs or  organizations: Not on file    Relationship status: Not on file  . Intimate partner violence:    Fear of current or ex partner: Not on file    Emotionally abused: Not on file    Physically abused: Not on file    Forced sexual activity: Not on file  Other Topics Concern  . Not on file  Social History Narrative  . Not on file   Functional Status Survey:    Allergies  Allergen Reactions  . Prevacid [Lansoprazole]     Pertinent  Health Maintenance Due  Topic Date Due  . FOOT EXAM  06/27/1939  . OPHTHALMOLOGY EXAM  06/27/1939  . PNA vac Low Risk Adult (1 of 2 - PCV13) 06/27/1996  . HEMOGLOBIN A1C  11/08/2015  . INFLUENZA VACCINE  Completed    Medications: Allergies as of 01/09/2018      Reactions   Prevacid [lansoprazole]       Medication List        Accurate as of 01/09/18  4:22 PM. Always use your most recent med list.          acetaminophen 325 MG tablet Commonly known as:  TYLENOL Take 650 mg by mouth every 4 (four) hours as needed. for pain/ increased temp. May be administered orally, per G-tube if needed or rectally if unable to swallow (separate order). Maximum dose for 24 hours is 3,000 mg from all sources of Acetaminophen/ Tylenol   aspirin 81 MG tablet Take 81 mg by mouth daily.   budesonide-formoterol 80-4.5 MCG/ACT inhaler Commonly known as:  SYMBICORT Inhale 2 puffs into the lungs 2 (two) times daily.   ENDIT EX Apply 1 application topically 2 (two) times daily as needed. to areas of skin breakdown/irritation to peri-area   ferrous sulfate 325 (65 FE) MG tablet Take 325 mg by mouth daily with breakfast.   glipiZIDE 5 MG tablet Commonly known as:  GLUCOTROL Take 5 mg by mouth 2 (two) times daily.   insulin glargine 100 unit/mL Sopn Commonly known as:  LANTUS Inject 16 Units into the skin every evening.   latanoprost 0.005 % ophthalmic solution Commonly known as:  XALATAN Place 1 drop into both eyes at bedtime.   losartan 25 MG  tablet Commonly known as:  COZAAR Take 1 tablet (25 mg total) by mouth daily.   mirabegron ER 25 MG Tb24 tablet Commonly known as:  MYRBETRIQ Take 1 tablet (25 mg total) by mouth daily.   NOVOLOG 100 UNIT/ML injection Generic drug:  insulin aspart Inject 6 Units into the skin 3 (three) times daily before meals.   simvastatin 40 MG tablet Commonly known as:  ZOCOR Take 40 mg by mouth daily.   tamsulosin 0.4 MG Caps capsule Commonly  known as:  FLOMAX Take 0.4 mg by mouth daily.   timolol 0.5 % ophthalmic solution Commonly known as:  BETIMOL Place 1 drop into the right eye daily.       Review of Systems  Constitutional: Negative for activity change, appetite change, chills, diaphoresis and fever.  HENT: Negative for congestion, mouth sores, nosebleeds, postnasal drip, sneezing, sore throat, trouble swallowing and voice change.   Respiratory: Negative for apnea, cough, choking, chest tightness, shortness of breath and wheezing.   Cardiovascular: Negative for chest pain, palpitations and leg swelling.  Gastrointestinal: Negative for abdominal distention, abdominal pain, constipation, diarrhea and nausea.  Genitourinary: Negative for difficulty urinating, dysuria, frequency and urgency.  Musculoskeletal: Positive for arthralgias (typical arthritis). Negative for back pain, gait problem and myalgias.  Skin: Negative for color change, pallor, rash and wound.  Neurological: Positive for weakness. Negative for dizziness, tremors, syncope, speech difficulty, numbness and headaches.  Psychiatric/Behavioral: Negative for agitation and behavioral problems.  All other systems reviewed and are negative.   Vitals:   01/07/18 0830  BP: 121/80  Pulse: 97  Resp: 18  Temp: 98.2 F (36.8 C)  SpO2: 100%   There is no height or weight on file to calculate BMI. Physical Exam  Constitutional: He is oriented to person, place, and time. Vital signs are normal. He appears well-developed and  well-nourished. He is active and cooperative. He does not appear ill. No distress.  HENT:  Head: Normocephalic and atraumatic.  Mouth/Throat: Uvula is midline, oropharynx is clear and moist and mucous membranes are normal. Mucous membranes are not pale, not dry and not cyanotic.  Eyes: Pupils are equal, round, and reactive to light. Conjunctivae, EOM and lids are normal.  Neck: Trachea normal, normal range of motion and full passive range of motion without pain. Neck supple. No JVD present. No tracheal deviation, no edema and no erythema present. No thyromegaly present.  Cardiovascular: Normal rate, regular rhythm, normal heart sounds, intact distal pulses and normal pulses. Exam reveals no gallop, no distant heart sounds and no friction rub.  No murmur heard. Pulses:      Dorsalis pedis pulses are 2+ on the right side, and 2+ on the left side.  No edema  Pulmonary/Chest: Effort normal. No accessory muscle usage. No respiratory distress. He has decreased breath sounds in the right lower field and the left lower field. He has no wheezes. He has no rhonchi. He has no rales. He exhibits no tenderness.  Abdominal: Soft. Normal appearance and bowel sounds are normal. He exhibits no distension and no ascites. There is no tenderness.  Musculoskeletal: Normal range of motion. He exhibits no edema or tenderness.  Expected osteoarthritis, stiffness; Bilateral Calves soft, supple. Negative Homan's Sign. B- pedal pulses equal; generalized weakness with deconditioning  Neurological: He is alert and oriented to person, place, and time. He has normal strength. Coordination and gait abnormal.  Skin: Skin is warm, dry and intact. He is not diaphoretic. No cyanosis. No pallor. Nails show no clubbing.  Psychiatric: He has a normal mood and affect. His speech is normal and behavior is normal. Judgment and thought content normal. Cognition and memory are normal.  Nursing note and vitals reviewed.   Labs  reviewed: Basic Metabolic Panel: No results for input(s): NA, K, CL, CO2, GLUCOSE, BUN, CREATININE, CALCIUM, MG, PHOS in the last 8760 hours. Liver Function Tests: No results for input(s): AST, ALT, ALKPHOS, BILITOT, PROT, ALBUMIN in the last 8760 hours. No results for input(s): LIPASE, AMYLASE in  the last 8760 hours. No results for input(s): AMMONIA in the last 8760 hours. CBC: No results for input(s): WBC, NEUTROABS, HGB, HCT, MCV, PLT in the last 8760 hours. Cardiac Enzymes: No results for input(s): CKTOTAL, CKMB, CKMBINDEX, TROPONINI in the last 8760 hours. BNP: Invalid input(s): POCBNP CBG: Recent Labs    02/11/17 1207  GLUCAP 155*    Procedures and Imaging Studies During Stay: No results found.  Assessment/Plan:   1. Community acquired pneumonia, unspecified laterality  Resolved  2. Generalized weakness  Continue PT/OT  Continue exercises as taught by PT/OT  Continue to TCDB/IS  Ambulate with assistance, with rolling walker  Follow up with PCP asap after discharge for continuity of care    Patient is being discharged with the following home health services: HHPT/OT/SW/NSG through [facility]  Patient is being discharged with the following durable medical equipment: none   Patient has been advised to f/u with their PCP in 1-2 weeks to bring them up to date on their rehab stay.  Social services at facility was responsible for arranging this appointment.  Pt was provided with a 30 day supply of prescriptions for medications and refills must be obtained from their PCP.  For controlled substances, a more limited supply may be provided adequate until PCP appointment only.  Future labs/tests needed:    Family/ staff Communication:   Total Time:  Documentation:  Face to Face:  Family/Phone:  Vikki Ports, NP-C [facility] [address], [place] [address] Cell Phone (Mon-Fri 8am-5pm):   [phone] On Call:  [phone] & follow prompts after 5pm & weekends Office Phone:  [phone] Office Fax:  [phone]

## 2018-04-24 ENCOUNTER — Encounter
Admission: EM | Disposition: A | Discharge status: Home / Self Care | Payer: Self-pay | Source: Home / Self Care | Attending: Internal Medicine

## 2018-04-24 ENCOUNTER — Other Ambulatory Visit: Payer: Self-pay

## 2018-04-24 ENCOUNTER — Encounter: Payer: Self-pay | Admitting: Emergency Medicine

## 2018-04-24 ENCOUNTER — Emergency Department: Payer: Medicare Other

## 2018-04-24 ENCOUNTER — Inpatient Hospital Stay
Admission: EM | Admit: 2018-04-24 | Discharge: 2018-04-30 | DRG: 389 | Disposition: A | Discharge status: Home / Self Care | Payer: Medicare Other | Attending: Internal Medicine | Admitting: Internal Medicine

## 2018-04-24 DIAGNOSIS — C641 Malignant neoplasm of right kidney, except renal pelvis: Secondary | ICD-10-CM | POA: Diagnosis present

## 2018-04-24 DIAGNOSIS — I951 Orthostatic hypotension: Secondary | ICD-10-CM | POA: Diagnosis present

## 2018-04-24 DIAGNOSIS — E860 Dehydration: Secondary | ICD-10-CM | POA: Diagnosis not present

## 2018-04-24 DIAGNOSIS — K3189 Other diseases of stomach and duodenum: Secondary | ICD-10-CM

## 2018-04-24 DIAGNOSIS — R531 Weakness: Secondary | ICD-10-CM

## 2018-04-24 DIAGNOSIS — K562 Volvulus: Principal | ICD-10-CM | POA: Diagnosis present

## 2018-04-24 DIAGNOSIS — K449 Diaphragmatic hernia without obstruction or gangrene: Secondary | ICD-10-CM | POA: Diagnosis present

## 2018-04-24 DIAGNOSIS — Z515 Encounter for palliative care: Secondary | ICD-10-CM

## 2018-04-24 DIAGNOSIS — Z794 Long term (current) use of insulin: Secondary | ICD-10-CM

## 2018-04-24 DIAGNOSIS — R55 Syncope and collapse: Secondary | ICD-10-CM

## 2018-04-24 DIAGNOSIS — N183 Chronic kidney disease, stage 3 (moderate): Secondary | ICD-10-CM | POA: Diagnosis present

## 2018-04-24 DIAGNOSIS — E1151 Type 2 diabetes mellitus with diabetic peripheral angiopathy without gangrene: Secondary | ICD-10-CM | POA: Diagnosis present

## 2018-04-24 DIAGNOSIS — E876 Hypokalemia: Secondary | ICD-10-CM | POA: Diagnosis present

## 2018-04-24 DIAGNOSIS — D638 Anemia in other chronic diseases classified elsewhere: Secondary | ICD-10-CM | POA: Diagnosis present

## 2018-04-24 DIAGNOSIS — E1122 Type 2 diabetes mellitus with diabetic chronic kidney disease: Secondary | ICD-10-CM | POA: Diagnosis present

## 2018-04-24 DIAGNOSIS — Z7982 Long term (current) use of aspirin: Secondary | ICD-10-CM

## 2018-04-24 DIAGNOSIS — N179 Acute kidney failure, unspecified: Secondary | ICD-10-CM | POA: Diagnosis present

## 2018-04-24 DIAGNOSIS — J449 Chronic obstructive pulmonary disease, unspecified: Secondary | ICD-10-CM | POA: Diagnosis present

## 2018-04-24 DIAGNOSIS — C7800 Secondary malignant neoplasm of unspecified lung: Secondary | ICD-10-CM | POA: Diagnosis present

## 2018-04-24 DIAGNOSIS — Z79899 Other long term (current) drug therapy: Secondary | ICD-10-CM

## 2018-04-24 DIAGNOSIS — K311 Adult hypertrophic pyloric stenosis: Secondary | ICD-10-CM

## 2018-04-24 DIAGNOSIS — D860 Sarcoidosis of lung: Secondary | ICD-10-CM | POA: Diagnosis present

## 2018-04-24 DIAGNOSIS — K440 Diaphragmatic hernia with obstruction, without gangrene: Secondary | ICD-10-CM

## 2018-04-24 DIAGNOSIS — I129 Hypertensive chronic kidney disease with stage 1 through stage 4 chronic kidney disease, or unspecified chronic kidney disease: Secondary | ICD-10-CM | POA: Diagnosis present

## 2018-04-24 DIAGNOSIS — C649 Malignant neoplasm of unspecified kidney, except renal pelvis: Secondary | ICD-10-CM

## 2018-04-24 DIAGNOSIS — Z66 Do not resuscitate: Secondary | ICD-10-CM

## 2018-04-24 LAB — COMPREHENSIVE METABOLIC PANEL
ALBUMIN: 3.6 g/dL (ref 3.5–5.0)
ALK PHOS: 47 U/L (ref 38–126)
ALT: 12 U/L (ref 0–44)
ANION GAP: 8 (ref 5–15)
AST: 23 U/L (ref 15–41)
BUN: 29 mg/dL — ABNORMAL HIGH (ref 8–23)
CALCIUM: 9.1 mg/dL (ref 8.9–10.3)
CO2: 27 mmol/L (ref 22–32)
CREATININE: 1.23 mg/dL (ref 0.61–1.24)
Chloride: 106 mmol/L (ref 98–111)
GFR calc Af Amer: 59 mL/min — ABNORMAL LOW (ref >60)
GFR calc non Af Amer: 51 mL/min — ABNORMAL LOW (ref >60)
GLUCOSE: 84 mg/dL (ref 70–99)
Potassium: 4 mmol/L (ref 3.5–5.1)
SODIUM: 141 mmol/L (ref 135–145)
Total Bilirubin: 0.7 mg/dL (ref 0.3–1.2)
Total Protein: 6.7 g/dL (ref 6.5–8.1)

## 2018-04-24 LAB — CBC
HCT: 29.6 % — ABNORMAL LOW (ref 40.0–52.0)
HEMOGLOBIN: 9.8 g/dL — AB (ref 13.0–18.0)
MCH: 30.4 pg (ref 26.0–34.0)
MCHC: 33.2 g/dL (ref 32.0–36.0)
MCV: 91.5 fL (ref 80.0–100.0)
Platelets: 241 10*3/uL (ref 150–440)
RBC: 3.24 MIL/uL — AB (ref 4.40–5.90)
RDW: 16.5 % — ABNORMAL HIGH (ref 11.5–14.5)
WBC: 5.7 10*3/uL (ref 3.8–10.6)

## 2018-04-24 LAB — URINALYSIS, COMPLETE (UACMP) WITH MICROSCOPIC
BACTERIA UA: NONE SEEN
Bilirubin Urine: NEGATIVE
Glucose, UA: NEGATIVE mg/dL
Hgb urine dipstick: NEGATIVE
KETONES UR: NEGATIVE mg/dL
LEUKOCYTES UA: NEGATIVE
Nitrite: NEGATIVE
PH: 7 (ref 5.0–8.0)
PROTEIN: 30 mg/dL — AB
SQUAMOUS EPITHELIAL / LPF: NONE SEEN (ref 0–5)
Specific Gravity, Urine: 1.011 (ref 1.005–1.030)
WBC UA: NONE SEEN WBC/hpf (ref 0–5)

## 2018-04-24 LAB — TROPONIN I: Troponin I: <0.03 ng/mL (ref <0.03)

## 2018-04-24 LAB — GLUCOSE, CAPILLARY: GLUCOSE-CAPILLARY: 141 mg/dL — AB (ref 70–99)

## 2018-04-24 SURGERY — SIGMOIDOSCOPY, FLEXIBLE
Anesthesia: Choice

## 2018-04-24 MED ORDER — GI COCKTAIL ~~LOC~~
30.0000 mL | Freq: Once | ORAL | Status: AC
  Administered 2018-04-24: 30 mL via ORAL
  Filled 2018-04-24: qty 30

## 2018-04-24 MED ORDER — MORPHINE SULFATE (PF) 4 MG/ML IV SOLN
4.0000 mg | Freq: Once | INTRAVENOUS | Status: AC
  Administered 2018-04-24: 4 mg via INTRAVENOUS
  Filled 2018-04-24: qty 1

## 2018-04-24 MED ORDER — FAMOTIDINE IN NACL 20-0.9 MG/50ML-% IV SOLN
20.0000 mg | Freq: Once | INTRAVENOUS | Status: AC
  Administered 2018-04-24: 20 mg via INTRAVENOUS
  Filled 2018-04-24: qty 50

## 2018-04-24 MED ORDER — SODIUM CHLORIDE 0.9 % IV BOLUS
1000.0000 mL | Freq: Once | INTRAVENOUS | Status: AC
  Administered 2018-04-24: 1000 mL via INTRAVENOUS

## 2018-04-24 MED ORDER — ONDANSETRON HCL 4 MG/2ML IJ SOLN
4.0000 mg | Freq: Once | INTRAMUSCULAR | Status: AC
  Administered 2018-04-24: 4 mg via INTRAVENOUS
  Filled 2018-04-24: qty 2

## 2018-04-24 MED ORDER — GLUCAGON HCL RDNA (DIAGNOSTIC) 1 MG IJ SOLR
1.0000 mg | Freq: Once | INTRAMUSCULAR | Status: AC
  Administered 2018-04-24: 1 mg via INTRAVENOUS
  Filled 2018-04-24 (×2): qty 1

## 2018-04-24 MED ORDER — SODIUM CHLORIDE 0.9 % IV SOLN
1000.0000 mL | Freq: Once | INTRAVENOUS | Status: AC
  Administered 2018-04-24: 1000 mL via INTRAVENOUS

## 2018-04-24 MED ORDER — IOHEXOL 300 MG/ML  SOLN
100.0000 mL | Freq: Once | INTRAMUSCULAR | Status: AC | PRN
  Administered 2018-04-24: 100 mL via INTRAVENOUS

## 2018-04-24 NOTE — ED Notes (Signed)
Pt brief changed. Entire bed changed and pt wiped down with bathing wipes. Gown changed. Pt repositioned in bed. Call bell within reach. Family at bedside.

## 2018-04-24 NOTE — ED Notes (Signed)
Additional warm blankets provided

## 2018-04-24 NOTE — ED Notes (Signed)
Surgeon remains at bedside.

## 2018-04-24 NOTE — ED Notes (Signed)
Pt stood for a second time with this nurse and nurse tech. Pt was able to stand easier but family states that the patient is still more weak than he normally is.

## 2018-04-24 NOTE — ED Notes (Signed)
Pt back from CT at this time 

## 2018-04-24 NOTE — ED Triage Notes (Addendum)
Pt comes into the ED via [facility] from home c/o syncopal episode while getting ready to eat.  Caregiver was able to ease him to the ground without hitting his head or anything else.  Patient came to and then had a second syncopal episode.  Patient was hypotensive with EMS.  CBG 100.  Patient alert and oriented x 4.  Denies any cardiac history.  Patient orthostatic with EMS.

## 2018-04-24 NOTE — ED Notes (Signed)
Pt vomiting. MD made aware.  

## 2018-04-24 NOTE — ED Notes (Signed)
Pt c/o indigestion that turned into vomiting, emesis x2 dark brown chunky emesis, pt stated he felt this way since 1500 after eating meal tray. RN aware.

## 2018-04-24 NOTE — ED Notes (Signed)
ED Provider at bedside. 

## 2018-04-24 NOTE — ED Notes (Signed)
Pt provided with sandwich and water to drink.

## 2018-04-24 NOTE — ED Provider Notes (Signed)
[facility] Emergency Department Provider Note   ____________________________________________    I have reviewed the triage vital signs and the nursing notes.   HISTORY  Chief Complaint Loss of Consciousness     HPI Keith Davila is a 84 y.o. male with a history of diabetes, COPD who presents after a syncopal episode.  Patient was apparently sitting at the table eating and was found to be slumped over.  Family laid him down on the floor and he immediately came to, currently he has no complaints and feels well.  Denies chest pain or palpitations.  No fevers or chills.  No shortness of breath.  Family reports he has a history of this in the past.  No new medications reported.  [facility]  Past Medical History:  Diagnosis Date  . Anemia   . Arthritis   . Cancer [facility])    prostate  . COPD (chronic obstructive pulmonary disease) ([place])   . Diabetes mellitus without complication ([place])   . GERD (gastroesophageal reflux disease)   . Hyperlipidemia   . Hypertension   . MRSA (methicillin resistant staph aureus) culture positive 2009  . Peripheral vascular disease ([place])   . Pneumonia   . prostate   . Renal disorder   . Varicose veins     Patient Active Problem List   Diagnosis Date Noted  . Urge incontinence of urine 09/30/2017  . Incomplete bladder emptying 09/30/2017  . Personal history of prostate cancer 09/30/2017  . Syncope 02/11/2017  . Peripheral vascular disease ([place]) 07/09/2016  . Varicose veins 07/09/2016  . Essential hypertension 07/09/2016  . Hypoglycemia associated with diabetes ([place]) 05/06/2015  . Anemia associated with chronic renal failure 11/06/2014  . Combined fat and carbohydrate induced hyperlipemia 11/06/2014  . Complication of diabetes mellitus ([place]) 09/27/2014    Past Surgical History:  Procedure Laterality Date  . BACK SURGERY    . ESOPHAGOGASTRODUODENOSCOPY (EGD) WITH PROPOFOL N/A 12/17/2016   Procedure: ESOPHAGOGASTRODUODENOSCOPY (EGD) WITH PROPOFOL;  Surgeon: Manya Silvas, MD;  Location: [facility] ENDOSCOPY;  Service: Endoscopy;  Laterality: N/A;  . HERNIA REPAIR    . JOINT REPLACEMENT    . SPINE SURGERY    . TILT TABLE STUDY N/A 02/11/2017   Procedure: Tilt Table Study;  Surgeon: Sanda Klein, MD;  Location: [place] CV LAB;  Service: Cardiovascular;  Laterality: N/A;    Prior to Admission medications   Medication Sig Start Date End Date Taking? Authorizing Provider  acetaminophen (TYLENOL) 325 MG tablet Take 650 mg by mouth every 4 (four) hours as needed. for pain/ increased temp. May be administered orally, per G-tube if needed or rectally if unable to swallow (separate order). Maximum dose for 24 hours is 3,000 mg from all sources of Acetaminophen/ Tylenol   Yes [provider]  albuterol (PROVENTIL HFA;VENTOLIN HFA) 108 (90 Base) MCG/ACT inhaler Inhale 2 puffs into the lungs 3 (three) times daily as needed for wheezing or shortness of breath.   Yes [provider]  aspirin 81 MG tablet Take 81 mg by mouth daily.   Yes [provider]  Brinzolamide-Brimonidine (SIMBRINZA) 1-0.2 % SUSP Place 1 drop into the right eye 3 (three) times daily.   Yes [provider]  Cinnamon 500 MG capsule Take 1,000 mg by mouth 2 (two) times daily.   Yes [provider]  ferrous sulfate 325 (65 FE) MG tablet Take 325 mg by mouth 2 (two) times daily.    Yes [provider]  insulin aspart (NOVOLOG) 100 UNIT/ML injection Inject 8u every morning with breakfast, 10u daily with lunch and 8u with dinner   Yes [provider]  insulin glargine (LANTUS) 100 unit/mL SOPN Inject 14 Units into the skin every evening.    Yes [provider]  latanoprost (XALATAN) 0.005 % ophthalmic solution Place 1 drop into both eyes at bedtime.    Yes [provider]  losartan (COZAAR) 25 MG tablet Take 1 tablet (25 mg total) by mouth daily.  02/11/17  Yes Croitoru, Mihai, MD  senna (SENOKOT) 8.6 MG TABS tablet Take 1 tablet by mouth every morning.   Yes [provider]  simvastatin (ZOCOR) 40 MG tablet Take 40 mg by mouth every evening.    Yes [provider]  tamsulosin (FLOMAX) 0.4 MG CAPS capsule Take 0.4 mg by mouth every evening.    Yes [provider]  timolol (BETIMOL) 0.5 % ophthalmic solution Place 1 drop into the right eye daily.    Yes [provider]  vitamin B-12 (CYANOCOBALAMIN) 1000 MCG tablet Take 1,000 mcg by mouth daily.   Yes [provider]  vitamin C (ASCORBIC ACID) 500 MG tablet Take 500 mg by mouth 2 (two) times daily.   Yes [provider]     Allergies Prevacid [lansoprazole]  Family History  Problem Relation Age of Onset  . Diabetes Other   . Diabetes Mother   . Heart attack Father     Social History Social History   Tobacco Use  . Smoking status: Former Research scientist (life sciences)  . Smokeless tobacco: Never Used  Substance Use Topics  . Alcohol use: No  . Drug use: No    Review of Systems  Constitutional: No fever/chills Eyes: No visual changes.  ENT: No sore throat. Cardiovascular: Denies chest pain. Respiratory: Denies shortness of breath. Gastrointestinal: No abdominal pain.  No nausea, no vomiting.   Genitourinary: Negative for dysuria. Musculoskeletal: Negative for back pain. Skin: Negative for rash. Neurological: Negative for headaches or weakness   ____________________________________________   PHYSICAL EXAM:  VITAL SIGNS: ED Triage Vitals  Enc Vitals Group     BP 04/24/18 1206 (!) 150/92     Pulse Rate 04/24/18 1206 70     Resp 04/24/18 1206 17     Temp 04/24/18 1206 97.6 F (36.4 C)     Temp Source 04/24/18 1206 Oral     SpO2 04/24/18 1206 100 %     Weight 04/24/18 1207 83.9 kg (185 lb)     Height 04/24/18 1207 1.88 m (6\' 2" )     Head Circumference --      Peak Flow --      Pain Score 04/24/18 1207 0     Pain Loc --       Pain Edu? --      Excl. in [place]? --     Constitutional: Alert and oriented. No acute distress. Pleasant and interactive Eyes: Conjunctivae are normal.   Nose: No congestion/rhinnorhea. Mouth/Throat: Mucous membranes are moist.    Cardiovascular: Normal rate, regular rhythm. Grossly normal heart sounds.  Good peripheral circulation. Respiratory: Normal respiratory effort.  No retractions. Lungs CTAB. Gastrointestinal: Soft and nontender. No distention.  No CVA tenderness.  Musculoskeletal: No lower extremity tenderness nor edema.  Warm and well perfused Neurologic:  Normal speech and language. No gross focal neurologic deficits are appreciated.  Skin:  Skin is warm, dry and intact. No rash noted. Psychiatric: Mood and affect are normal. Speech and  behavior are normal.  ____________________________________________   LABS (all labs ordered are listed, but only abnormal results are displayed)  Labs Reviewed  CBC - Abnormal; Notable for the following components:      Result Value   RBC 3.24 (*)    Hemoglobin 9.8 (*)    HCT 29.6 (*)    RDW 16.5 (*)    All other components within normal limits  COMPREHENSIVE METABOLIC PANEL - Abnormal; Notable for the following components:   BUN 29 (*)    GFR calc non Af Amer 51 (*)    GFR calc Af Amer 59 (*)    All other components within normal limits  TROPONIN I  URINALYSIS, COMPLETE (UACMP) WITH MICROSCOPIC   ____________________________________________  EKG  ED ECG REPORT I, Lavonia Drafts, the attending physician, personally viewed and interpreted this ECG.  Date: 04/24/2018  Rhythm: normal sinus rhythm QRS Axis: normal Intervals: Bundle branch block ST/T Wave abnormalities: normal Narrative Interpretation: no evidence of acute ischemia  ____________________________________________  RADIOLOGY  None ____________________________________________   PROCEDURES  Procedure(s) performed: No  Procedures   Critical Care  performed: No ____________________________________________   INITIAL IMPRESSION / ASSESSMENT AND PLAN / ED COURSE  Pertinent labs & imaging results that were available during my care of the patient were reviewed by me and considered in my medical decision making (see chart for details).  Patient well-appearing in no acute distress.  Will check labs, give IV fluids based on a cardiac monitor and reevaluate  ----------------------------------------- 1:26 PM on 04/24/2018 -----------------------------------------  Lab work is overall reassuring, mild dehydration with elevated BUN, will perform orthostatics  ----------------------------------------- 2:17 PM on 04/24/2018 -----------------------------------------  Patient's orthostatics are essentially normal however he felt lightheaded when he stood up, will give an additional liter of fluid and reevaluate. Additionally patient's daughter reports strong smelling urine, will send urinalysis.  Have asked Dr. Archie Balboa to reevaluate patient for fluids and if asymptomatic appropriate for discharge    ____________________________________________   FINAL CLINICAL IMPRESSION(S) / ED DIAGNOSES  Final diagnoses:  Syncope, unspecified syncope type  Dehydration        Note:  This document was prepared using Dragon voice recognition software and may include unintentional dictation errors.    Lavonia Drafts, MD 04/24/18 [phone]

## 2018-04-24 NOTE — ED Notes (Signed)
Report from previous rn. Pt with ng in place. Surgeon at bedside speaking with family and pt.

## 2018-04-25 DIAGNOSIS — Z515 Encounter for palliative care: Secondary | ICD-10-CM | POA: Diagnosis not present

## 2018-04-25 DIAGNOSIS — K562 Volvulus: Secondary | ICD-10-CM | POA: Diagnosis present

## 2018-04-25 DIAGNOSIS — I129 Hypertensive chronic kidney disease with stage 1 through stage 4 chronic kidney disease, or unspecified chronic kidney disease: Secondary | ICD-10-CM | POA: Diagnosis present

## 2018-04-25 DIAGNOSIS — C7800 Secondary malignant neoplasm of unspecified lung: Secondary | ICD-10-CM | POA: Diagnosis present

## 2018-04-25 DIAGNOSIS — N183 Chronic kidney disease, stage 3 (moderate): Secondary | ICD-10-CM | POA: Diagnosis present

## 2018-04-25 DIAGNOSIS — Z7982 Long term (current) use of aspirin: Secondary | ICD-10-CM | POA: Diagnosis not present

## 2018-04-25 DIAGNOSIS — K769 Liver disease, unspecified: Secondary | ICD-10-CM | POA: Diagnosis not present

## 2018-04-25 DIAGNOSIS — K3189 Other diseases of stomach and duodenum: Secondary | ICD-10-CM | POA: Diagnosis present

## 2018-04-25 DIAGNOSIS — I10 Essential (primary) hypertension: Secondary | ICD-10-CM | POA: Diagnosis not present

## 2018-04-25 DIAGNOSIS — N189 Chronic kidney disease, unspecified: Secondary | ICD-10-CM | POA: Diagnosis not present

## 2018-04-25 DIAGNOSIS — J449 Chronic obstructive pulmonary disease, unspecified: Secondary | ICD-10-CM | POA: Diagnosis present

## 2018-04-25 DIAGNOSIS — C649 Malignant neoplasm of unspecified kidney, except renal pelvis: Secondary | ICD-10-CM | POA: Diagnosis not present

## 2018-04-25 DIAGNOSIS — R531 Weakness: Secondary | ICD-10-CM | POA: Diagnosis not present

## 2018-04-25 DIAGNOSIS — N179 Acute kidney failure, unspecified: Secondary | ICD-10-CM | POA: Diagnosis present

## 2018-04-25 DIAGNOSIS — D638 Anemia in other chronic diseases classified elsewhere: Secondary | ICD-10-CM | POA: Diagnosis present

## 2018-04-25 DIAGNOSIS — D631 Anemia in chronic kidney disease: Secondary | ICD-10-CM | POA: Diagnosis not present

## 2018-04-25 DIAGNOSIS — E860 Dehydration: Secondary | ICD-10-CM | POA: Diagnosis present

## 2018-04-25 DIAGNOSIS — I739 Peripheral vascular disease, unspecified: Secondary | ICD-10-CM | POA: Diagnosis not present

## 2018-04-25 DIAGNOSIS — Z794 Long term (current) use of insulin: Secondary | ICD-10-CM | POA: Diagnosis not present

## 2018-04-25 DIAGNOSIS — K449 Diaphragmatic hernia without obstruction or gangrene: Secondary | ICD-10-CM | POA: Diagnosis present

## 2018-04-25 DIAGNOSIS — R918 Other nonspecific abnormal finding of lung field: Secondary | ICD-10-CM | POA: Diagnosis not present

## 2018-04-25 DIAGNOSIS — K440 Diaphragmatic hernia with obstruction, without gangrene: Secondary | ICD-10-CM

## 2018-04-25 DIAGNOSIS — Z66 Do not resuscitate: Secondary | ICD-10-CM | POA: Diagnosis present

## 2018-04-25 DIAGNOSIS — E876 Hypokalemia: Secondary | ICD-10-CM | POA: Diagnosis present

## 2018-04-25 DIAGNOSIS — E1122 Type 2 diabetes mellitus with diabetic chronic kidney disease: Secondary | ICD-10-CM | POA: Diagnosis present

## 2018-04-25 DIAGNOSIS — C641 Malignant neoplasm of right kidney, except renal pelvis: Secondary | ICD-10-CM | POA: Diagnosis present

## 2018-04-25 DIAGNOSIS — E1151 Type 2 diabetes mellitus with diabetic peripheral angiopathy without gangrene: Secondary | ICD-10-CM | POA: Diagnosis present

## 2018-04-25 DIAGNOSIS — K311 Adult hypertrophic pyloric stenosis: Secondary | ICD-10-CM | POA: Diagnosis present

## 2018-04-25 DIAGNOSIS — D860 Sarcoidosis of lung: Secondary | ICD-10-CM | POA: Diagnosis present

## 2018-04-25 DIAGNOSIS — Z79899 Other long term (current) drug therapy: Secondary | ICD-10-CM | POA: Diagnosis not present

## 2018-04-25 DIAGNOSIS — I951 Orthostatic hypotension: Secondary | ICD-10-CM | POA: Diagnosis present

## 2018-04-25 DIAGNOSIS — R591 Generalized enlarged lymph nodes: Secondary | ICD-10-CM | POA: Diagnosis not present

## 2018-04-25 DIAGNOSIS — E279 Disorder of adrenal gland, unspecified: Secondary | ICD-10-CM | POA: Diagnosis not present

## 2018-04-25 LAB — BASIC METABOLIC PANEL
Anion gap: 11 (ref 5–15)
BUN: 30 mg/dL — ABNORMAL HIGH (ref 8–23)
CHLORIDE: 107 mmol/L (ref 98–111)
CO2: 27 mmol/L (ref 22–32)
CREATININE: 1.34 mg/dL — AB (ref 0.61–1.24)
Calcium: 8.6 mg/dL — ABNORMAL LOW (ref 8.9–10.3)
GFR calc Af Amer: 54 mL/min — ABNORMAL LOW (ref >60)
GFR calc non Af Amer: 46 mL/min — ABNORMAL LOW (ref >60)
Glucose, Bld: 184 mg/dL — ABNORMAL HIGH (ref 70–99)
Potassium: 3.8 mmol/L (ref 3.5–5.1)
Sodium: 145 mmol/L (ref 135–145)

## 2018-04-25 LAB — TROPONIN I: Troponin I: <0.03 ng/mL (ref <0.03)

## 2018-04-25 LAB — CBC
HEMATOCRIT: 29.1 % — AB (ref 40.0–52.0)
HEMOGLOBIN: 9.6 g/dL — AB (ref 13.0–18.0)
MCH: 30.4 pg (ref 26.0–34.0)
MCHC: 33.1 g/dL (ref 32.0–36.0)
MCV: 91.9 fL (ref 80.0–100.0)
PLATELETS: 245 10*3/uL (ref 150–440)
RBC: 3.17 MIL/uL — AB (ref 4.40–5.90)
RDW: 16.3 % — ABNORMAL HIGH (ref 11.5–14.5)
WBC: 7.5 10*3/uL (ref 3.8–10.6)

## 2018-04-25 LAB — GLUCOSE, CAPILLARY
GLUCOSE-CAPILLARY: 143 mg/dL — AB (ref 70–99)
Glucose-Capillary: 125 mg/dL — ABNORMAL HIGH (ref 70–99)
Glucose-Capillary: 144 mg/dL — ABNORMAL HIGH (ref 70–99)
Glucose-Capillary: 120 mg/dL — ABNORMAL HIGH (ref 70–99)

## 2018-04-25 LAB — MRSA PCR SCREENING: MRSA by PCR: NEGATIVE

## 2018-04-25 MED ORDER — TRAZODONE HCL 50 MG PO TABS
25.0000 mg | ORAL_TABLET | Freq: Every evening | ORAL | Status: DC | PRN
  Administered 2018-04-27: 22:00:00 25 mg via ORAL
  Filled 2018-04-25: qty 1

## 2018-04-25 MED ORDER — INSULIN ASPART 100 UNIT/ML ~~LOC~~ SOLN
0.0000 [IU] | Freq: Three times a day (TID) | SUBCUTANEOUS | Status: DC
  Administered 2018-04-25 – 2018-04-27 (×9): 1 [IU] via SUBCUTANEOUS
  Administered 2018-04-28: 2 [IU] via SUBCUTANEOUS
  Administered 2018-04-28: 12:00:00 5 [IU] via SUBCUTANEOUS
  Administered 2018-04-28: 7 [IU] via SUBCUTANEOUS
  Administered 2018-04-29: 12:00:00 5 [IU] via SUBCUTANEOUS
  Administered 2018-04-29: 3 [IU] via SUBCUTANEOUS
  Administered 2018-04-29: 17:00:00 7 [IU] via SUBCUTANEOUS
  Administered 2018-04-30: 3 [IU] via SUBCUTANEOUS
  Administered 2018-04-30: 12:00:00 5 [IU] via SUBCUTANEOUS
  Filled 2018-04-25 (×15): qty 1

## 2018-04-25 MED ORDER — HYDROCODONE-ACETAMINOPHEN 5-325 MG PO TABS: 1.0000 | ORAL_TABLET | ORAL | Status: DC | PRN

## 2018-04-25 MED ORDER — METHYLPREDNISOLONE SODIUM SUCC 125 MG IJ SOLR: 125.0000 mg | Freq: Once | INTRAMUSCULAR | Status: DC

## 2018-04-25 MED ORDER — ALBUTEROL SULFATE (2.5 MG/3ML) 0.083% IN NEBU: 2.5000 mg | INHALATION_SOLUTION | Freq: Three times a day (TID) | RESPIRATORY_TRACT | Status: DC | PRN

## 2018-04-25 MED ORDER — SODIUM CHLORIDE 0.9 % IV SOLN
INTRAVENOUS | Status: DC
  Administered 2018-04-25 – 2018-04-26 (×2): via INTRAVENOUS

## 2018-04-25 MED ORDER — ONDANSETRON HCL 4 MG PO TABS: 4.0000 mg | ORAL_TABLET | Freq: Four times a day (QID) | ORAL | Status: DC | PRN

## 2018-04-25 MED ORDER — HEPARIN SODIUM (PORCINE) 5000 UNIT/ML IJ SOLN
5000.0000 [IU] | Freq: Three times a day (TID) | INTRAMUSCULAR | Status: DC
  Administered 2018-04-25 – 2018-04-30 (×15): 5000 [IU] via SUBCUTANEOUS
  Filled 2018-04-25 (×15): qty 1

## 2018-04-25 MED ORDER — ONDANSETRON HCL 4 MG/2ML IJ SOLN: 4.0000 mg | Freq: Four times a day (QID) | INTRAMUSCULAR | Status: DC | PRN

## 2018-04-25 MED ORDER — ACETAMINOPHEN 325 MG PO TABS: 650.0000 mg | ORAL_TABLET | Freq: Four times a day (QID) | ORAL | Status: DC | PRN

## 2018-04-25 MED ORDER — ACETAMINOPHEN 650 MG RE SUPP: 650.0000 mg | Freq: Four times a day (QID) | RECTAL | Status: DC | PRN

## 2018-04-25 MED ORDER — BISACODYL 5 MG PO TBEC
5.0000 mg | DELAYED_RELEASE_TABLET | Freq: Every day | ORAL | Status: DC | PRN
  Administered 2018-04-26: 22:00:00 5 mg via ORAL
  Filled 2018-04-25: qty 1

## 2018-04-25 MED ORDER — DOCUSATE SODIUM 100 MG PO CAPS
100.0000 mg | ORAL_CAPSULE | Freq: Two times a day (BID) | ORAL | Status: DC
  Administered 2018-04-27 – 2018-04-30 (×6): 100 mg via ORAL
  Filled 2018-04-25 (×6): qty 1

## 2018-04-25 MED ORDER — INSULIN ASPART 100 UNIT/ML ~~LOC~~ SOLN
0.0000 [IU] | Freq: Every day | SUBCUTANEOUS | Status: DC
  Administered 2018-04-27: 3 [IU] via SUBCUTANEOUS
  Administered 2018-04-28 – 2018-04-29 (×2): 2 [IU] via SUBCUTANEOUS
  Filled 2018-04-25 (×3): qty 1

## 2018-04-25 MED ORDER — LATANOPROST 0.005 % OP SOLN
1.0000 [drp] | Freq: Every day | OPHTHALMIC | Status: DC
  Administered 2018-04-25 – 2018-04-29 (×5): 1 [drp] via OPHTHALMIC
  Filled 2018-04-25: qty 2.5

## 2018-04-25 NOTE — Plan of Care (Signed)
  Problem: Education: Goal: Knowledge of General Education information will improve Outcome: Progressing   Problem: Health Behavior/Discharge Planning: Goal: Ability to manage health-related needs will improve Outcome: Progressing   Problem: Clinical Measurements: Goal: Ability to maintain clinical measurements within normal limits will improve Outcome: Progressing Goal: Will remain free from infection Outcome: Progressing Goal: Diagnostic test results will improve Outcome: Progressing Goal: Respiratory complications will improve Outcome: Progressing Goal: Cardiovascular complication will be avoided Outcome: Progressing   Problem: Activity: Goal: Risk for activity intolerance will decrease Outcome: Progressing   Problem: Nutrition: Goal: Adequate nutrition will be maintained Outcome: Progressing   Problem: Elimination: Goal: Will not experience complications related to bowel motility Outcome: Progressing Goal: Will not experience complications related to urinary retention Outcome: Progressing   Problem: Pain Managment: Goal: General experience of comfort will improve Outcome: Progressing   Problem: Safety: Goal: Ability to remain free from injury will improve Outcome: Progressing   Problem: Skin Integrity: Goal: Risk for impaired skin integrity will decrease Outcome: Progressing

## 2018-04-25 NOTE — H&P (Signed)
[facility] NAME: Keith Davila    MR#:  [id number]  DATE OF BIRTH:  10/17/1929  DATE OF ADMISSION:  04/24/2018  PRIMARY CARE PHYSICIAN: Desiree Hane, PA   REQUESTING/REFERRING PHYSICIAN:   CHIEF COMPLAINT:   Chief Complaint  Patient presents with  . Loss of Consciousness    HISTORY OF PRESENT ILLNESS: Keith Davila  is a 84 y.o. male, [facility], with a known history of anemia of chronic disease, arthritis, prostate cancer, COPD, diabetes type 2 and other comorbidities. Patient was brought to emergency room status post witnessed syncopal episode, while sitting at the table.  Per family he was out for few seconds.  No reports of chest pain, palpitations, fever, chills, shortness of breath.  Patient had similar episodes in the past, he was diagnosed with vasovagal syncopal episodes. While in the emergency room, patient became nauseated and started to throw up.  He also complained of epigastric pain.  A CAT scan of the abdomen was ordered and the results revealed gastric volvulus and right renal carcinoma with possible metastasis to adrenal glands and liver. Blood test done emergency room are remarkable for hemoglobin level at 9.8 and creatinine level 1.23.  Troponin level is lower than 0.03. Patient is admitted for further evaluation and treatment.    PAST MEDICAL HISTORY:   Past Medical History:  Diagnosis Date  . Anemia   . Arthritis   . Cancer [facility])    prostate  . COPD (chronic obstructive pulmonary disease) ([place])   . Diabetes mellitus without complication ([place])   . GERD (gastroesophageal reflux disease)   . Hyperlipidemia   . Hypertension   . MRSA (methicillin resistant staph aureus) culture positive 2009  . Peripheral vascular disease ([place])   . Pneumonia   . prostate   . Renal disorder   . Varicose veins     PAST SURGICAL HISTORY:  Past Surgical History:  Procedure Laterality Date  . BACK SURGERY     . ESOPHAGOGASTRODUODENOSCOPY (EGD) WITH PROPOFOL N/A 12/17/2016   Procedure: ESOPHAGOGASTRODUODENOSCOPY (EGD) WITH PROPOFOL;  Surgeon: Manya Silvas, MD;  Location: [facility] ENDOSCOPY;  Service: Endoscopy;  Laterality: N/A;  . HERNIA REPAIR    . JOINT REPLACEMENT    . SPINE SURGERY    . TILT TABLE STUDY N/A 02/11/2017   Procedure: Tilt Table Study;  Surgeon: Sanda Klein, MD;  Location: [facility] CV LAB;  Service: Cardiovascular;  Laterality: N/A;    SOCIAL HISTORY:  Social History   Tobacco Use  . Smoking status: Former Research scientist (life sciences)  . Smokeless tobacco: Never Used  Substance Use Topics  . Alcohol use: No    FAMILY HISTORY:  Family History  Problem Relation Age of Onset  . Diabetes Other   . Diabetes Mother   . Heart attack Father     DRUG ALLERGIES:  Allergies  Allergen Reactions  . Prevacid [Lansoprazole]     REVIEW OF SYSTEMS:   CONSTITUTIONAL: No fever, fatigue or weakness.  EYES: No blurred or double vision.  EARS, NOSE, AND THROAT: No tinnitus or ear pain.  RESPIRATORY: No cough, shortness of breath, wheezing or hemoptysis.  CARDIOVASCULAR: Positive for syncopal episode.  No chest pain, orthopnea, edema.  GASTROINTESTINAL: Positive for nausea, vomiting and epigastric pain.  No diarrhea.  GENITOURINARY: No dysuria, hematuria.  ENDOCRINE: No polyuria, nocturia,  HEMATOLOGY: No bleeding SKIN: No rash or lesion. MUSCULOSKELETAL: No joint pain.   NEUROLOGIC: No focal weakness.  PSYCHIATRY: No anxiety  or depression.   MEDICATIONS AT HOME:  Prior to Admission medications   Medication Sig Start Date End Date Taking? Authorizing Provider  acetaminophen (TYLENOL) 325 MG tablet Take 650 mg by mouth every 4 (four) hours as needed. for pain/ increased temp. May be administered orally, per G-tube if needed or rectally if unable to swallow (separate order). Maximum dose for 24 hours is 3,000 mg from all sources of Acetaminophen/ Tylenol   Yes [provider]   albuterol (PROVENTIL HFA;VENTOLIN HFA) 108 (90 Base) MCG/ACT inhaler Inhale 2 puffs into the lungs 3 (three) times daily as needed for wheezing or shortness of breath.   Yes [provider]  aspirin 81 MG tablet Take 81 mg by mouth daily.   Yes [provider]  Brinzolamide-Brimonidine (SIMBRINZA) 1-0.2 % SUSP Place 1 drop into the right eye 3 (three) times daily.   Yes [provider]  Cinnamon 500 MG capsule Take 1,000 mg by mouth 2 (two) times daily.   Yes [provider]  ferrous sulfate 325 (65 FE) MG tablet Take 325 mg by mouth 2 (two) times daily.    Yes [provider]  insulin aspart (NOVOLOG) 100 UNIT/ML injection Inject 8u every morning with breakfast, 10u daily with lunch and 8u with dinner   Yes [provider]  insulin glargine (LANTUS) 100 unit/mL SOPN Inject 14 Units into the skin every evening.    Yes [provider]  latanoprost (XALATAN) 0.005 % ophthalmic solution Place 1 drop into both eyes at bedtime.    Yes [provider]  losartan (COZAAR) 25 MG tablet Take 1 tablet (25 mg total) by mouth daily. 02/11/17  Yes Croitoru, Mihai, MD  senna (SENOKOT) 8.6 MG TABS tablet Take 1 tablet by mouth every morning.   Yes [provider]  simvastatin (ZOCOR) 40 MG tablet Take 40 mg by mouth every evening.    Yes [provider]  tamsulosin (FLOMAX) 0.4 MG CAPS capsule Take 0.4 mg by mouth every evening.    Yes [provider]  timolol (BETIMOL) 0.5 % ophthalmic solution Place 1 drop into the right eye daily.    Yes [provider]  vitamin B-12 (CYANOCOBALAMIN) 1000 MCG tablet Take 1,000 mcg by mouth daily.   Yes [provider]  vitamin C (ASCORBIC ACID) 500 MG tablet Take 500 mg by mouth 2 (two) times daily.   Yes [provider]      PHYSICAL EXAMINATION:   VITAL SIGNS: Blood pressure (!) 152/95, pulse (!) 104, temperature 97.6 F (36.4 C), temperature  source Oral, resp. rate 16, height 6\' 2"  (1.88 m), weight 83.9 kg (185 lb), SpO2 93 %.  GENERAL:  84 y.o.-year-old patient lying in the bed with no acute distress.  EYES: Pupils equal, round, reactive to light and accommodation. No scleral icterus.  HEENT: Head atraumatic, normocephalic. Oropharynx and nasopharynx clear.  NECK:  Supple, no jugular venous distention. No thyroid enlargement, no tenderness.  LUNGS: Reduced breath sounds bilaterally, no wheezing, rales,rhonchi or crepitation. No use of accessory muscles of respiration.  CARDIOVASCULAR: S1, S2 normal. No S3/S4.  ABDOMEN: Soft, mildly nondistended and tender in the epigastric area. Bowel sounds present. EXTREMITIES: No pedal edema, cyanosis, or clubbing.  NEUROLOGIC: No focal weakness. PSYCHIATRIC: The patient is alert and oriented x 3.  SKIN: No obvious rash, lesion, or ulcer.   LABORATORY PANEL:   CBC Recent Labs  Lab 04/24/18 1209  WBC 5.7  HGB 9.8*  HCT 29.6*  PLT  241  MCV 91.5  MCH 30.4  MCHC 33.2  RDW 16.5*   ------------------------------------------------------------------------------------------------------------------  Chemistries  Recent Labs  Lab 04/24/18 1209  NA 141  K 4.0  CL 106  CO2 27  GLUCOSE 84  BUN 29*  CREATININE 1.23  CALCIUM 9.1  AST 23  ALT 12  ALKPHOS 47  BILITOT 0.7   ------------------------------------------------------------------------------------------------------------------ estimated creatinine clearance is 50.1 mL/min (by C-G formula based on SCr of 1.23 mg/dL). ------------------------------------------------------------------------------------------------------------------ No results for input(s): TSH, T4TOTAL, T3FREE, THYROIDAB in the last 72 hours.  Invalid input(s): FREET3   Coagulation profile No results for input(s): INR, PROTIME in the last 168  hours. ------------------------------------------------------------------------------------------------------------------- No results for input(s): DDIMER in the last 72 hours. -------------------------------------------------------------------------------------------------------------------  Cardiac Enzymes Recent Labs  Lab 04/24/18 1209 04/24/18 1631  TROPONINI <0.03 <0.03   ------------------------------------------------------------------------------------------------------------------ Invalid input(s): POCBNP  ---------------------------------------------------------------------------------------------------------------  Urinalysis    Component Value Date/Time   COLORURINE STRAW (A) 04/24/2018 1439   APPEARANCEUR CLEAR (A) 04/24/2018 1439   LABSPEC 1.011 04/24/2018 1439   PHURINE 7.0 04/24/2018 1439   GLUCOSEU NEGATIVE 04/24/2018 1439   HGBUR NEGATIVE 04/24/2018 1439   BILIRUBINUR NEGATIVE 04/24/2018 1439   KETONESUR NEGATIVE 04/24/2018 1439   PROTEINUR 30 (A) 04/24/2018 1439   NITRITE NEGATIVE 04/24/2018 1439   LEUKOCYTESUR NEGATIVE 04/24/2018 1439     RADIOLOGY: Ct Abdomen Pelvis W Contrast  Result Date: 04/24/2018 CLINICAL DATA:  Vomiting. EXAM: CT ABDOMEN AND PELVIS WITH CONTRAST TECHNIQUE: Multidetector CT imaging of the abdomen and pelvis was performed using the standard protocol following bolus administration of intravenous contrast. CONTRAST:  174mL OMNIPAQUE IOHEXOL 300 MG/ML  SOLN COMPARISON:  None. FINDINGS: Lower chest: Calcified granulomas are seen lung bases. There are several uncalcified nodules as well. Atelectasis adjacent to a large hiatal hernia. No pneumonia or aspiration. There is a large hiatal hernia which is fluid-filled and dilated. The hernia displaces the heart anteriorly. Coronary artery calcifications are noted. There appears to be adenopathy in the right hilum, incompletely evaluated. Hepatobiliary: There is a wedge-shaped region of decreased  attenuation in the liver such as on series 2, image 21 which dissipates on delayed imaging consistent with a perfusion anomaly of no acute significance. There is a mass in the right hepatic lobe on series 2, image 15 measuring 3.2 cm with an attenuation of 28 Hounsfield units. This mass persists on delayed imaging. No other liver masses identified. The portal vein is not well assessed on initial imaging but is opacified on delayed imaging. No portal vein thrombosis noted. The gallbladder is normal. Pancreas: Pancreatic duct is mildly prominent but pancreas is otherwise normal. No evidence of pancreatitis or pancreatic mass. Spleen: Normal in size without focal abnormality. Adrenals/Urinary Tract: There is an indeterminate left adrenal nodule measuring 14 mm. There is a small right adrenal nodule which is indeterminate as well measuring 11 mm. There is a mass in the posterior right kidney on series 2, image 22 measuring up to 6 cm consistent with malignancy. There may be some subtle nodularity adjacent to the kidney as well on series 2, image 21. There is a more subtle region of decreased attenuation anteriorly in the right kidney on series 2, image 21. Another small malignancy is not excluded although this region is indeterminate. There are bilateral renal cysts. No hydronephrosis. No ureteral stones. The bladder is normal. Stomach/Bowel: The hiatal hernia which is quite large is distended and fluid-filled. The stomach become smaller in caliber as it passes into the abdomen as seen on series  2, image 19. The stomach below the diaphragm is twisted with the gastric outlet on the left as seen on series 2, image 18 and coronal image 33. The duodenum then makes 180 degree turn back to the right. The duodenum is decompressed. No small bowel obstruction. There is a stool ball in the rectum. The sigmoid colon extends into the upper abdomen between the peritoneum and the dilated stomach. No convincing evidence of sigmoid  volvulus the remainder of the colon is unremarkable. Visualized appendix is unremarkable. Vascular/Lymphatic: The abdominal aorta is tortuous. The infrarenal abdominal aorta measures 2.6 cm. Atherosclerotic changes extend from the abdominal aorta into the iliac and femoral vessels. No adenopathy in the abdomen. Reproductive: Brachytherapy seeds seen in the prostate. Other: There is a fat containing ventral hernia. No free air or free fluid. Musculoskeletal: Degenerative changes seen in the spine. IMPRESSION: 1. There is a gastric volvulus. There is clearly a volvulus centered at the gastric outlet which is located in the left upper quadrant. There is also a distended fluid-filled hiatal hernia. The stomach becomes smaller in caliber as the hernia extends through the hiatus but there is no definitive volvulus at the level of the hiatus. 2. 6 cm right renal carcinoma. There may be some mild nodularity adjacent to the left kidney in this region as well. A more subtle region anteriorly in the left kidney could represent complicated cystic change versus another tiny malignancy. MRI could better assess. 3. Apparent adenopathy in the right hilum. Given the right renal malignancy, the findings are concerning for metastatic disease but incompletely evaluated. 4. Multiple pulmonary nodules. Many are calcified consistent with previous granulomatous disease. Some are not calcified. I suspect the uncalcified nodules may simply represent uncalcified granulomas but metastatic disease would be difficult to completely exclude. 5. There is an indeterminate mass in the liver described above. This is not fill on in on delayed images and is not a typical hemangioma. It could represent a complicated cyst. However, metastatic lesion is not completely excluded. 6. Redundant sigmoid colon. The sigmoid colon is mildly distended between the dilated stomach an the peritoneum but there is no definitive volvulus in this region. 7. Indeterminate  adrenal nodules. While adenomas are possible, given the malignancy in the kidney, metastatic disease should be considered. 8. Mildly tortuous atherosclerotic abdominal aorta. Ectatic abdominal aorta at risk for aneurysm development. Recommend followup by ultrasound in 5 years. This recommendation follows ACR consensus guidelines: White Paper of the ACR Incidental Findings Committee II on Vascular Findings. J Am Coll Radiol 2013; 10:789-794. The patient may benefit from a PET-CT to further evaluate the apparent adenopathy in the right hilum, the mass in the liver, in the adrenal nodularity. This could be done non emergently. Findings called to Dr. Archie Balboa. Electronically Signed   By: Dorise Bullion III M.D   On: 04/24/2018 21:23   Dg Chest Portable 1 View  Result Date: 04/24/2018 CLINICAL DATA:  Initial evaluation status post NG tube placement. EXAM: PORTABLE CHEST 1 VIEW COMPARISON:  Prior radiograph from 05/06/2015. FINDINGS: NG tube seen coursing inferiorly into the upper stomach with nonvisualization of the tip. Cardiomegaly, stable from previous. Mediastinal silhouette within normal limits. Aortic atherosclerosis. Lungs are hypoinflated. Small bilateral pleural effusions. Perihilar vascular congestion without frank pulmonary edema. No definite focal airspace disease. No pneumothorax. No acute osseus abnormality. IMPRESSION: 1. Enteric tube in place, seen coursing into the upper abdomen with nonvisualization of the tip. 2. Cardiomegaly with perihilar vascular congestion and probable small bilateral pleural effusions.  3. Aortic atherosclerosis. Electronically Signed   By: Jeannine Boga M.D.   On: 04/24/2018 22:57    EKG: Orders placed or performed during the hospital encounter of 04/24/18  . ED EKG  . ED EKG  . EKG 12-Lead  . EKG 12-Lead    IMPRESSION AND PLAN:  1.  Recurrent syncope, likely vasovagal.  Continue to monitor patient clinically closely.  Continue telemetry monitor and follow  troponin levels, to rule out ACS. 2.  Acute gastric volvulus.  We will start palliative nasogastric decompression.  We will keep patient n.p.o. for now.  Patient is not a surgical candidate, due to his complicated, numerous comorbidities. 3.  Right renal mass, highly suspicious for metastatic renal cell carcinoma, newly diagnosed per abdominal CAT scan. Oncology is consulted for further evaluation and treatment. 4.  Diabetes type 2.  Will monitor blood sugars before meals and at bedtime and use insulin treatment during the hospital stay. 5.  COPD, without acute exacerbation.  Continue home maintenance therapy. 6.  CKD 3, stable, creatinine at baseline.  We will continue to monitor kidney function closely and avoid nephrotoxic medications.  Overall, this patient has a very poor long-term prognosis, he might be a good candidate for palliative care team consult.  All the records are reviewed and case discussed with ED provider. Management plans discussed with the patient, family and they are in agreement.  CODE STATUS: Full Code Status History    Date Active Date Inactive Code Status Order ID Comments User Context   05/06/2015 2105 05/08/2015 1552 Full Code [id number]  Hower, Aaron Mose, MD ED       TOTAL TIME TAKING CARE OF THIS PATIENT: 50 minutes.    Amelia Jo M.D on 04/25/2018 at 12:49 AM  Between 7am to 6pm - Pager - [phone]  After 6pm go to [email or web address] - password [email or web address] [facility]  Office  [phone]  CC: Primary care physician; Desiree Hane, [place]

## 2018-04-25 NOTE — Consult Note (Addendum)
SURGICAL CONSULTATION NOTE (initial) - cpt: 78242  HISTORY OF PRESENT ILLNESS (HPI):  84 y.o. male presented to [facility] ED today for evaluation of a recurrent episode of syncope while patient was eating at his table and was found by his daughter and granddaughter to be "slumped over". Family was advised by EMS to lay patient on the floor, which they did, after which patient recovered consciousness. While in the ED today, patient was given a [place] sandwich, which he ate and then subsequently vomited 500 mL of dark brown liquid, after which patient complained of epigastric abdominal pain and nausea, for which CT scan was performed. Patient and his family report he's had similar lesser GERD + dysphasia x 15 years, which have worsened over past 2 years, during which barium swallow and EGD were performed by Dr. Vira Agar, which confirmed large hiatal hernia with chronic volvulus. During same time, patient was diagnosed with Right renal cell carcinoma, and patient was advised and agreed not to pursue surgical management for cancer or gastric diagnoses and has similarly not considered chemotherapy. Most of patient's care has been at the [place], and patient's history is obtained from patient, his daughter, and his granddaughter, but cannot be confirmed due to lack of access to [place] records overnight on this weekend. Patient's family state Right RCC measured 4 cm on most recent imaging study ~2 months ago without extrarenal metastases. Patient currently denies fever/chills or CP, reports complete resolution of epigastric pain and nausea after 2 Liters thin dark brown fluid suctioned via NG tube, and is described as experiencing visible mild - moderate respiratory difficulty with even just conversation despite denial of SOB. Patient and his family clearly and consistently state they do not wish to consider surgery for any of the above problems under any circumstances.  Surgery is consulted by ED physician Dr. Archie Balboa in this  context for evaluation and management of chronic large hiatal hernia with chronic gastric volvulus.  PAST MEDICAL HISTORY (PMH):  Past Medical History:  Diagnosis Date  . Anemia   . Arthritis   . Cancer [facility])    prostate  . COPD (chronic obstructive pulmonary disease) ([place])   . Diabetes mellitus without complication ([place])   . GERD (gastroesophageal reflux disease)   . Hyperlipidemia   . Hypertension   . MRSA (methicillin resistant staph aureus) culture positive 2009  . Peripheral vascular disease ([place])   . Pneumonia   . prostate   . Renal disorder   . Varicose veins      PAST SURGICAL HISTORY ([place]):  Past Surgical History:  Procedure Laterality Date  . BACK SURGERY    . ESOPHAGOGASTRODUODENOSCOPY (EGD) WITH PROPOFOL N/A 12/17/2016   Procedure: ESOPHAGOGASTRODUODENOSCOPY (EGD) WITH PROPOFOL;  Surgeon: Manya Silvas, MD;  [facility] END[facility]COPY;  Service: Endoscopy;  Laterality: N/A;  . HERNIA REPAIR    . JOINT REPLACEMENT    . SPINE SURGERY    . TILT TABLE STUDY N/A 02/11/2017   Procedure: Tilt Table Study;  Surgeon: Sanda Klein, MD;  Location: [facility] LAB;  Service: Cardiovascular;  Laterality: N/A;     MEDICATIONS:  Prior to Admission medications   Medication Sig Start Date End Date Taking? Authorizing Provider  acetaminophen (TYLENOL) 325 MG tablet Take 650 mg by mouth every 4 (four) hours as needed. for pain/ increased temp. May be administered orally, per G-tube if needed or rectally if unable to swallow (separate order). Maximum dose for 24 hours is 3,000 mg from all sources of Acetaminophen/ Tylenol  Yes [provider]  albuterol (PROVENTIL HFA;VENTOLIN HFA) 108 (90 Base) MCG/ACT inhaler Inhale 2 puffs into the lungs 3 (three) times daily as needed for wheezing or shortness of breath.   Yes [provider]  aspirin 81 MG tablet Take 81 mg by mouth daily.   Yes [provider]  Brinzolamide-Brimonidine (SIMBRINZA) 1-0.2 % SUSP  Place 1 drop into the right eye 3 (three) times daily.   Yes [provider]  Cinnamon 500 MG capsule Take 1,000 mg by mouth 2 (two) times daily.   Yes [provider]  ferrous sulfate 325 (65 FE) MG tablet Take 325 mg by mouth 2 (two) times daily.    Yes [provider]  insulin aspart (NOVOLOG) 100 UNIT/ML injection Inject 8u every morning with breakfast, 10u daily with lunch and 8u with dinner   Yes [provider]  insulin glargine (LANTUS) 100 unit/mL SOPN Inject 14 Units into the skin every evening.    Yes [provider]  latanoprost (XALATAN) 0.005 % ophthalmic solution Place 1 drop into both eyes at bedtime.    Yes [provider]  losartan (COZAAR) 25 MG tablet Take 1 tablet (25 mg total) by mouth daily. 02/11/17  Yes Croitoru, Mihai, MD  senna (SENOKOT) 8.6 MG TABS tablet Take 1 tablet by mouth every morning.   Yes [provider]  simvastatin (ZOCOR) 40 MG tablet Take 40 mg by mouth every evening.    Yes [provider]  tamsulosin (FLOMAX) 0.4 MG CAPS capsule Take 0.4 mg by mouth every evening.    Yes [provider]  timolol (BETIMOL) 0.5 % ophthalmic solution Place 1 drop into the right eye daily.    Yes [provider]  vitamin B-12 (CYANOCOBALAMIN) 1000 MCG tablet Take 1,000 mcg by mouth daily.   Yes [provider]  vitamin C (ASCORBIC ACID) 500 MG tablet Take 500 mg by mouth 2 (two) times daily.   Yes [provider]     ALLERGIES:  Allergies  Allergen Reactions  . Prevacid [Lansoprazole]      SOCIAL HISTORY:  Social History   Socioeconomic History  . Marital status: Widowed    Spouse name: Not on file  . Number of children: Not on file  . Years of education: Not on file  . Highest education level: Not on file  Occupational History  . Not on file  Social Needs  . Financial resource strain: Not on file  . Food insecurity:    Worry: Not on file    Inability:  Not on file  . Transportation needs:    Medical: Not on file    Non-medical: Not on file  Tobacco Use  . Smoking status: Former Research scientist (life sciences)  . Smokeless tobacco: Never Used  Substance and Sexual Activity  . Alcohol use: No  . Drug use: No  . Sexual activity: Not on file  Lifestyle  . Physical activity:    Days per week: Not on file    Minutes per session: Not on file  . Stress: Not on file  Relationships  . Social connections:    Talks on phone: Not on file    Gets together: Not on file    Attends religious service: Not on file    Active member of club or organization: Not on file    Attends meetings of clubs or organizations: Not on file    Relationship status: Not on file  . Intimate partner violence:  Fear of current or ex partner: Not on file    Emotionally abused: Not on file    Physically abused: Not on file    Forced sexual activity: Not on file  Other Topics Concern  . Not on file  Social History Narrative  . Not on file    The patient currently resides (home / rehab facility / nursing home): Home The patient normally is (ambulatory / bedbound): Ambulatory   FAMILY HISTORY:  Family History  Problem Relation Age of Onset  . Diabetes Other   . Diabetes Mother   . Heart attack Father      REVIEW OF SYSTEMS:  Constitutional: reports +weight loss, denies fever/chills  Eyes: denies any other vision changes, history of eye injury  ENT: +dysphasia, denies sore throat, hearing problems  Respiratory: shortness of breath as per HPI, denies wheezing  Cardiovascular: denies chest pain, palpitations  Gastrointestinal: abdominal pain, N/V, and bowel function as per HPI Genitourinary: denies burning with urination or urinary frequency Musculoskeletal: denies any other joint pains or cramps  Skin: denies any other rashes or skin discolorations  Neurological: denies any other headache, dizziness, weakness  Psychiatric: denies any other depression, anxiety   All other  review of systems were negative   VITAL SIGNS:  Temp:  [97.6 F (36.4 C)] 97.6 F (36.4 C) (07/06 1206) Pulse Rate:  [61-99] 99 (07/06 2330) Resp:  [13-18] 16 (07/06 2205) BP: (134-195)/(83-106) 134/83 (07/06 2330) SpO2:  [94 %-100 %] 94 % (07/06 2330) Weight:  [185 lb (83.9 kg)] 185 lb (83.9 kg) (07/06 1207)     Height: 6\' 2"  (188 cm) Weight: 185 lb (83.9 kg) BMI (Calculated): 23.74   INTAKE/OUTPUT:  This shift: No intake/output data recorded.  Last 2 shifts: @IOLAST2SHIFTS @   PHYSICAL EXAM:  Constitutional:  -- Normal body habitus  -- Awake, alert, and oriented x3, no apparent distress Eyes:  -- Pupils equally round and reactive to light  -- No scleral icterus, B/L no occular discharge Ear, nose, throat: -- Neck is FROM WNL -- No jugular venous distension  Pulmonary:  -- No wheezes or rhales -- Equal breath sounds bilaterally -- Breathing non-labored at rest Cardiovascular:  -- S1, S2 present  -- No pericardial rubs  Gastrointestinal:  -- Abdomen soft, nontender, non-distended, no guarding or rebound tenderness -- No abdominal masses appreciated, pulsatile or otherwise, NG tube well-secured with nearly 2 Liters thin dark brown fluid in canisters Musculoskeletal and Integumentary:  -- Wounds or skin discoloration: None appreciated -- Extremities: B/L UE and LE FROM, hands and feet warm, no edema  Neurologic:  -- Motor function: Intact and symmetric -- Sensation: Intact and symmetric Psychiatric:  -- Mood and affect WNL  Labs:  CBC Latest Ref Rng & Units 04/24/2018 05/06/2015  WBC 3.8 - 10.6 K/uL 5.7 7.4  Hemoglobin 13.0 - 18.0 g/dL 9.8(L) 12.1(L)  Hematocrit 40.0 - 52.0 % 29.6(L) 37.5(L)  Platelets 150 - 440 K/uL 241 223   CMP Latest Ref Rng & Units 04/24/2018 05/07/2015 05/06/2015  Glucose 70 - 99 mg/dL 84 235(H) 78  BUN 8 - 23 mg/dL 29(H) 29(H) 29(H)  Creatinine 0.61 - 1.24 mg/dL 1.23 1.35(H) 1.44(H)  Sodium 135 - 145 mmol/L 141 136 133(L)  Potassium 3.5 - 5.1  mmol/L 4.0 4.0 3.3(L)  Chloride 98 - 111 mmol/L 106 102 95(L)  CO2 22 - 32 mmol/L 27 27 28   Calcium 8.9 - 10.3 mg/dL 9.1 8.5(L) 8.8(L)  Total Protein 6.5 - 8.1 g/dL 6.7 - 8.2(H)  Total Bilirubin 0.3 - 1.2 mg/dL 0.7 - 0.3  Alkaline Phos 38 - 126 U/L 47 - 66  AST 15 - 41 U/L 23 - 38  ALT 0 - 44 U/L 12 - 22   Imaging studies:  CT Abdomen and Pelvis with Contrast (04/24/2018) - personally reviewed with patient, his daughter, and his granddaughter bedside 1. There is a gastric volvulus. There is clearly a volvulus centered at the gastric outlet which is located in the left upper quadrant. There is also a distended fluid-filled hiatal hernia. The stomach becomes smaller in caliber as the hernia extends through the hiatus but there is no definitive volvulus at the level of the hiatus. 2. 6 cm right renal carcinoma. There may be some mild nodularity adjacent to the left kidney in this region as well. A more subtle region anteriorly in the left kidney could represent complicated cystic change versus another tiny malignancy. MRI could better assess. 3. Apparent adenopathy in the right hilum. Given the right renal malignancy, the findings are concerning for metastatic disease but incompletely evaluated. 4. Multiple pulmonary nodules. Many are calcified consistent with previous granulomatous disease. Some are not calcified. I suspect the uncalcified nodules may simply represent uncalcified granulomas but metastatic disease would be difficult to completely exclude. 5. There is an indeterminate mass in the liver described above. This is not fill on in on delayed images and is not a typical hemangioma. It could represent a complicated cyst. However, metastatic lesion is not completely excluded. 6. Redundant sigmoid colon. The sigmoid colon is mildly distended between the dilated stomach an the peritoneum but there is no definitive volvulus in this region. 7. Indeterminate adrenal nodules. While  adenomas are possible, given the malignancy in the kidney, metastatic disease should be considered. 8. Mildly tortuous atherosclerotic abdominal aorta. Ectatic abdominal aorta at risk for aneurysm development. Recommend followup by ultrasound in 5 years.  The patient may benefit from a PET-CT to further evaluate the apparent adenopathy in the right hilum, the mass in the liver, in the adrenal nodularity. This could be done non emergently.  Assessment/Plan: (ICD-10's: K56.2, K44.0) 84 y.o. male with chronic large and progressively symptomatic hiatal hernia with likewise chronic gastric volvulus, complicated by also known and untreated enlarging and possibly metastatic 6 cm Right renal cell carcinoma and by pertinent comorbidities including DM, HTN, HLD, peripheral arterial disease, aortic ectasia, CKD, COPD not on home supplemental oxygen, chronic anemia, GERD, prostate cancer, and osteoarthritis.   - palliative nasogastric decompression anticipated for 24 - 48 hours   - if surgery were to be considered, have discussed with family would clearly require transfer to tertiary facility  - considering no interest in surgery by patient and family, recommend medical admission and management of comorbidities  - anticipate removal of NG tube in 24 - 48 hours, attempt diet as tolerated, and likely discharge with outpatient [facility] follow-[facility]   - syncope workup and medical management as per primary medical team  - DVT prophylaxis and ambulation encouraged  All of the above findings and recommendations were discussed with the patient and his family at length as well as with ED physician, and all of patient's and his family's questions were answered to their expressed satisfaction.  Thank you for the opportunity to participate in this patient's care.   -- Marilynne Drivers Rosana Hoes, MD, [place]: [facility] - Partnering for exceptional care. Office: [phone]

## 2018-04-25 NOTE — Progress Notes (Signed)
Family Meeting Note  Advance Directive:yes  Today a meeting took place with the Patient. Discussed with patient about CPR, ventilator.  Patient told me that he is 84 year old, he worked in the [employer], and [place] transportation, he is very well aware of these things and he does not want any CPR or put on ventilator if any's time for him to go each time for him to go he does not want any procedures.  He understands that he may have a right kidney cancer and also gastric volvulus but he told me that he does not know that he has a cancer until this admission.  Regardless he does not want CPR or ventilator.  Oncology consulted to discuss about his treatment options for cancer.  .    The following clinical team members were present during this meeting:MD  The following were discussed:Patient's diagnosis: , Patient's progosis: Unable to determine and Goals for treatment: DNR  Additional follow-up to be provided: We will follow  Time spent during discussion:20 minutes  Epifanio Lesches, MD

## 2018-04-25 NOTE — Progress Notes (Signed)
Admitted for near syncope and patient has gastric volvulus.  Patient has been telling me that he feels full and has abdominal pain, nausea that prompted the admission to ER where he passed out.  Patient has NG tube to suction now.  He tells me that he does not want any resuscitation or intubation.  Labs data, CT results reviewed.  I spoke with Dr. Rosana Hoes also.  84 year old male patient with large and progressively symptomatic hiatal hernia with chronic gastric volvulus, untreated metastatic right renal cell carcinoma with mets to lung and liver 1.  Acute gastric volvulus with abdominal pain, nausea prompted to have his syncope rather than acute syncope problem.  Continue NG tube to suction, surgery mentioned that continue NG tube for at least 24 to 48 hours before discontinuing, continue IV fluids, patient has palliative NG tube decompression at this time patient can have ice chips. 2.  Consult palliative care after discussing goals of care and patient told me he does not want intubation or resuscitation or treatment for cancer.  #3 syncope likely secondary to abdominal pain, nausea rather than acute syncope.  Continue IV fluids.  Right kidney cancer with metastatic disease to lung and adrenal.  Oncology consulted.   Spent 25 minutes

## 2018-04-25 NOTE — Progress Notes (Signed)
Patient seen and examined, says his abdominal pain and nausea have completely resolved and he describes feeling better than he has in many months. Patient also discussed with social worker/case management and with medical physician, Dr. Vianne Bulls.  BP 135/79 (BP Location: Left Arm)   Pulse 95   Temp 98.8 F (37.1 C) (Oral)   Resp 17   Ht 6\' 2"  (1.88 m)   Wt 175 lb 8 oz (79.6 kg)   SpO2 99%   BMI 22.53 kg/m    GI: Abdomen soft, completely non-tender,and non-distended  Assessment/Plan: (ICD-10's: K56.2, K44.0) 84 y.o. male with chronic large and progressively symptomatic hiatal hernia with likewise chronic gastric volvulus, complicated by also known and untreated enlarged and possibly metastatic 6 cm Right renal cell carcinoma and by pertinent comorbidities including DM, HTN, HLD, peripheral arterial disease, aortic ectasia, CKD, COPD not on home supplemental oxygen, chronic anemia, GERD, prostate cancer, and osteoarthritis.              - palliative nasogastric decompression anticipated for 24 - 48 hours with ice chips and/or hard candy prn             - anticipate removal of NG tube tomorrow, attempt diet as tolerated, and likely discharge with [facility] follow-[facility]  - palliative care consultation to discuss hospice would certainly be consistent with patient and his family's stated goals              - syncope workup and medical management as per primary medical team             - DVT prophylaxis and ambulation encouraged  All of the above findings and recommendations were discussed with the patient, social work/case management, medical physician, and patient's RN, and all of patient's questions were answered to their expressed satisfaction.  Thank you for the opportunity to participate in this patient's care.   -- Marilynne Drivers Rosana Hoes, MD, [place]: [facility] - Partnering for exceptional care. Office: [phone]

## 2018-04-25 NOTE — ED Notes (Signed)
Pt and family updated regarding admission process. Pt and family verbalize understanding.

## 2018-04-25 NOTE — ED Notes (Signed)
Attempt to call report without success, rn unavailable.

## 2018-04-26 ENCOUNTER — Inpatient Hospital Stay: Payer: Medicare Other

## 2018-04-26 ENCOUNTER — Inpatient Hospital Stay
Admit: 2018-04-26 | Discharge: 2018-04-26 | Disposition: A | Discharge status: Home / Self Care | Payer: Medicare Other | Attending: Internal Medicine | Admitting: Internal Medicine

## 2018-04-26 DIAGNOSIS — C649 Malignant neoplasm of unspecified kidney, except renal pelvis: Secondary | ICD-10-CM

## 2018-04-26 DIAGNOSIS — I10 Essential (primary) hypertension: Secondary | ICD-10-CM

## 2018-04-26 DIAGNOSIS — Z515 Encounter for palliative care: Secondary | ICD-10-CM

## 2018-04-26 DIAGNOSIS — Z66 Do not resuscitate: Secondary | ICD-10-CM

## 2018-04-26 DIAGNOSIS — K311 Adult hypertrophic pyloric stenosis: Secondary | ICD-10-CM

## 2018-04-26 DIAGNOSIS — D631 Anemia in chronic kidney disease: Secondary | ICD-10-CM

## 2018-04-26 DIAGNOSIS — E279 Disorder of adrenal gland, unspecified: Secondary | ICD-10-CM

## 2018-04-26 DIAGNOSIS — Z8546 Personal history of malignant neoplasm of prostate: Secondary | ICD-10-CM

## 2018-04-26 DIAGNOSIS — Z87891 Personal history of nicotine dependence: Secondary | ICD-10-CM

## 2018-04-26 DIAGNOSIS — K769 Liver disease, unspecified: Secondary | ICD-10-CM

## 2018-04-26 DIAGNOSIS — K3189 Other diseases of stomach and duodenum: Secondary | ICD-10-CM

## 2018-04-26 DIAGNOSIS — I739 Peripheral vascular disease, unspecified: Secondary | ICD-10-CM

## 2018-04-26 DIAGNOSIS — E1122 Type 2 diabetes mellitus with diabetic chronic kidney disease: Secondary | ICD-10-CM

## 2018-04-26 DIAGNOSIS — J449 Chronic obstructive pulmonary disease, unspecified: Secondary | ICD-10-CM

## 2018-04-26 DIAGNOSIS — R918 Other nonspecific abnormal finding of lung field: Secondary | ICD-10-CM

## 2018-04-26 DIAGNOSIS — C641 Malignant neoplasm of right kidney, except renal pelvis: Secondary | ICD-10-CM

## 2018-04-26 DIAGNOSIS — N189 Chronic kidney disease, unspecified: Secondary | ICD-10-CM

## 2018-04-26 DIAGNOSIS — R591 Generalized enlarged lymph nodes: Secondary | ICD-10-CM

## 2018-04-26 LAB — ECHOCARDIOGRAM COMPLETE
HEIGHTINCHES: 74 in
Weight: 2747.81 oz

## 2018-04-26 LAB — BASIC METABOLIC PANEL
ANION GAP: 10 (ref 5–15)
BUN: 22 mg/dL (ref 8–23)
CHLORIDE: 106 mmol/L (ref 98–111)
CO2: 29 mmol/L (ref 22–32)
Calcium: 8.4 mg/dL — ABNORMAL LOW (ref 8.9–10.3)
Creatinine, Ser: 1.2 mg/dL (ref 0.61–1.24)
GFR, EST NON AFRICAN AMERICAN: 53 mL/min — AB (ref >60)
Glucose, Bld: 147 mg/dL — ABNORMAL HIGH (ref 70–99)
POTASSIUM: 3.3 mmol/L — AB (ref 3.5–5.1)
Sodium: 145 mmol/L (ref 135–145)

## 2018-04-26 LAB — CBC
HEMATOCRIT: 30.6 % — AB (ref 40.0–52.0)
Hemoglobin: 10.2 g/dL — ABNORMAL LOW (ref 13.0–18.0)
MCH: 30.1 pg (ref 26.0–34.0)
MCHC: 33.2 g/dL (ref 32.0–36.0)
MCV: 90.6 fL (ref 80.0–100.0)
PLATELETS: 234 10*3/uL (ref 150–440)
RBC: 3.38 MIL/uL — AB (ref 4.40–5.90)
RDW: 16.5 % — ABNORMAL HIGH (ref 11.5–14.5)
WBC: 7.9 10*3/uL (ref 3.8–10.6)

## 2018-04-26 LAB — GLUCOSE, CAPILLARY
GLUCOSE-CAPILLARY: 128 mg/dL — AB (ref 70–99)
GLUCOSE-CAPILLARY: 123 mg/dL — AB (ref 70–99)
Glucose-Capillary: 123 mg/dL — ABNORMAL HIGH (ref 70–99)
Glucose-Capillary: 120 mg/dL — ABNORMAL HIGH (ref 70–99)

## 2018-04-26 MED ORDER — HYDRALAZINE HCL 20 MG/ML IJ SOLN: 10.0000 mg | Freq: Four times a day (QID) | INTRAMUSCULAR | Status: DC | PRN

## 2018-04-26 MED ORDER — HYDRALAZINE HCL 20 MG/ML IJ SOLN
20.0000 mg | Freq: Once | INTRAMUSCULAR | Status: AC
  Administered 2018-04-26: 15:00:00 20 mg via INTRAVENOUS
  Filled 2018-04-26: qty 1

## 2018-04-26 MED ORDER — TIMOLOL MALEATE 0.5 % OP SOLN
1.0000 [drp] | Freq: Every day | OPHTHALMIC | Status: DC
  Administered 2018-04-26 – 2018-04-29 (×4): 1 [drp] via OPHTHALMIC
  Filled 2018-04-26: qty 5

## 2018-04-26 MED ORDER — POTASSIUM CHLORIDE IN NACL 20-0.9 MEQ/L-% IV SOLN
INTRAVENOUS | Status: DC
  Administered 2018-04-26 – 2018-04-27 (×2): via INTRAVENOUS
  Filled 2018-04-26 (×4): qty 1000

## 2018-04-26 MED ORDER — TAMSULOSIN HCL 0.4 MG PO CAPS
0.4000 mg | ORAL_CAPSULE | Freq: Every evening | ORAL | Status: DC
  Administered 2018-04-27 – 2018-04-29 (×3): 0.4 mg via ORAL
  Filled 2018-04-26 (×3): qty 1

## 2018-04-26 MED ORDER — TIMOLOL HEMIHYDRATE 0.5 % OP SOLN: 1.0000 [drp] | Freq: Every day | OPHTHALMIC | Status: DC

## 2018-04-26 MED ORDER — ASPIRIN EC 81 MG PO TBEC
81.0000 mg | DELAYED_RELEASE_TABLET | Freq: Every day | ORAL | Status: DC
  Administered 2018-04-28 – 2018-04-30 (×3): 81 mg via ORAL
  Filled 2018-04-26 (×3): qty 1

## 2018-04-26 NOTE — Progress Notes (Signed)
BP 177/94.  Spoke with Dr Manuella Ghazi and he is okay with BP at this time.  No new orders. Dorna Bloom RN

## 2018-04-26 NOTE — Progress Notes (Signed)
*  PRELIMINARY RESULTS* Echocardiogram 2D Echocardiogram has been performed.  Keith Davila Keith Davila 04/26/2018, 1:24 PM

## 2018-04-26 NOTE — Consult Note (Signed)
Hematology/Oncology Consult note [facility] [phone] [phone]  Patient Care Team: Desiree Hane, [place] as PCP - General (Physician Assistant) Desiree Hane, [place] (Physician Assistant)   Name of the patient: Keith Davila  [id number]  [birth year]    Reason for consult- renal cell carcinoma   Requesting physician: - Dr. Vianne Bulls  Date of visit: 04/26/2018    History of presenting illness-patient is a 84 year old male with a past medical history significant for diabetes, COPD and anemia of chronic disease among other medical problems.  He follows up at the [facility] for these issues.  Patient was also noted to have a right renal mass which is followed by [facility] oncology as well as urology.  He is also known to have lung nodules and mediastinal adenopathy which has been worked up at the [place] and patient underwent a lung biopsy at the [place] according to his daughter which did not reveal any malignancy.  She mentions a possibility of sarcoidosis however I do not [facility] any [facility] to [facility].  Patient was admitted yesterday to the hospital with symptoms of abdominal pain and syncopal episode which he experienced while sitting at the table.  He underwent CT abdomen pelvis with contrast on 04/24/2018 which showed: 6 cm right renal cell carcinoma.  Aspirin adenopathy in the right hilum.  Multiple pulmonary nodules.  Indeterminate mass about 3.2 cm in the liver it could represent a complicated cyst.  However metastatic lesion is not completely excluded.  Indeterminate adrenal nodules.  While adenomas a possible given the malignancy and the kidney metastatic disease should also be considered.  Gastric volvulus was also noted  Currently patient has an NG tube to suction in place for his gastric volvulus.  His biggest present concern has been inability to eat and abdominal pain and bloating after he eats as well as recurrent episodes of syncope.  Surgery has been on board for this  and have recommended conservative management at this time.  ECOG PS- 2    Review of systems- Review of Systems  Constitutional: Positive for malaise/fatigue.  Gastrointestinal: Positive for abdominal pain.    Allergies  Allergen Reactions  . Prevacid [Lansoprazole]     Patient Active Problem List   Diagnosis Date Noted  . Gastric volvulus 04/25/2018  . Urge incontinence of urine 09/30/2017  . Incomplete bladder emptying 09/30/2017  . Personal history of prostate cancer 09/30/2017  . Syncope 02/11/2017  . Peripheral vascular disease ([place]) 07/09/2016  . Varicose veins 07/09/2016  . Essential hypertension 07/09/2016  . Hypoglycemia associated with diabetes ([place]) 05/06/2015  . Anemia associated with chronic renal failure 11/06/2014  . Combined fat and carbohydrate induced hyperlipemia 11/06/2014  . Complication of diabetes mellitus ([place]) 09/27/2014     Past Medical History:  Diagnosis Date  . Anemia   . Arthritis   . Cancer [facility])    prostate  . COPD (chronic obstructive pulmonary disease) ([place])   . Diabetes mellitus without complication ([place])   . GERD (gastroesophageal reflux disease)   . Hyperlipidemia   . Hypertension   . MRSA (methicillin resistant staph aureus) culture positive 2009  . Peripheral vascular disease ([place])   . Pneumonia   . prostate   . Renal disorder   . Varicose veins      Past Surgical History:  Procedure Laterality Date  . BACK SURGERY    . ESOPHAGOGASTRODUODENOSCOPY (EGD) WITH PROPOFOL N/A 12/17/2016   Procedure: ESOPHAGOGASTRODUODENOSCOPY (EGD) WITH PROPOFOL;  Surgeon: Manya Silvas, MD;  Location: [facility] ENDOSCOPY;  Service: Endoscopy;  Laterality: N/A;  . HERNIA REPAIR    . JOINT REPLACEMENT    . SPINE SURGERY    . TILT TABLE STUDY N/A 02/11/2017   Procedure: Tilt Table Study;  Surgeon: Sanda Klein, MD;  Location: [facility];  Service: Cardiovascular;  Laterality: N/A;    Social History   Socioeconomic History  .  Marital status: Widowed    Spouse name: Not on file  . Number of children: Not on file  . Years of education: Not on file  . Highest education level: Not on file  Occupational History  . Not on file  Social Needs  . Financial resource strain: Not on file  . Food insecurity:    Worry: Not on file    Inability: Not on file  . Transportation needs:    Medical: Not on file    Non-medical: Not on file  Tobacco Use  . Smoking status: Former Research scientist (life sciences)  . Smokeless tobacco: Never Used  Substance and Sexual Activity  . Alcohol use: No  . Drug use: No  . Sexual activity: Not on file  Lifestyle  . Physical activity:    Days per week: Not on file    Minutes per session: Not on file  . Stress: Not on file  Relationships  . Social connections:    Talks on phone: Not on file    Gets together: Not on file    Attends religious service: Not on file    Active member of club or organization: Not on file    Attends meetings of clubs or organizations: Not on file    Relationship status: Not on file  . Intimate partner violence:    Fear of current or ex partner: Not on file    Emotionally abused: Not on file    Physically abused: Not on file    Forced sexual activity: Not on file  Other Topics Concern  . Not on file  Social History Narrative  . Not on file     Family History  Problem Relation Age of Onset  . Diabetes Other   . Diabetes Mother   . Heart attack Father      Current Facility-Administered Medications:  .  0.9 % NaCl with KCl 20 mEq/ L  infusion, , Intravenous, Continuous, Epifanio Lesches, MD, Last Rate: 50 mL/hr at 04/26/18 1214 .  acetaminophen (TYLENOL) tablet 650 mg, 650 mg, Oral, Q6H PRN **OR** acetaminophen (TYLENOL) suppository 650 mg, 650 mg, Rectal, Q6H PRN, Amelia Jo, MD .  albuterol (PROVENTIL) (2.5 MG/3ML) 0.083% nebulizer solution 2.5 mg, 2.5 mg, Inhalation, TID PRN, Amelia Jo, MD .  aspirin EC tablet 81 mg, 81 mg, Oral, Daily, Epifanio Lesches, MD .  bisacodyl (DULCOLAX) EC tablet 5 mg, 5 mg, Oral, Daily PRN, Amelia Jo, MD .  docusate sodium (COLACE) capsule 100 mg, 100 mg, Oral, BID, Amelia Jo, MD .  heparin injection 5,000 Units, 5,000 Units, Subcutaneous, Q8H, Amelia Jo, MD, 5,000 Units at 04/26/18 1430 .  hydrALAZINE (APRESOLINE) injection 10 mg, 10 mg, Intravenous, Q6H PRN, Epifanio Lesches, MD .  HYDROcodone-acetaminophen (NORCO/VICODIN) 5-325 MG per tablet 1-2 tablet, 1-2 tablet, Oral, Q4H PRN, Amelia Jo, MD .  insulin aspart (novoLOG) injection 0-5 Units, 0-5 Units, Subcutaneous, QHS, Amelia Jo, MD .  insulin aspart (novoLOG) injection 0-9 Units, 0-9 Units, Subcutaneous, TID WC, Amelia Jo, MD, 1 Units at 04/26/18 1216 .  latanoprost (XALATAN) 0.005 % ophthalmic solution 1 drop, 1  drop, Both Eyes, QHS, Amelia Jo, MD, 1 drop at 04/25/18 2133 .  ondansetron (ZOFRAN) tablet 4 mg, 4 mg, Oral, Q6H PRN **OR** ondansetron (ZOFRAN) injection 4 mg, 4 mg, Intravenous, Q6H PRN, Amelia Jo, MD .  tamsulosin (FLOMAX) capsule 0.4 mg, 0.4 mg, Oral, QPM, Kon[place]ena, Snehalatha, MD .  timolol (TIMOPTIC) 0.5 % ophthalmic solution 1 drop, 1 drop, Right Eye, Daily, Epifanio Lesches, MD, 1 drop at 04/26/18 1218 .  traZODone (DESYREL) tablet 25 mg, 25 mg, Oral, QHS PRN, Amelia Jo, MD   Physical exam:  Vitals:   04/25/18 1953 04/26/18 0459 04/26/18 1233 04/26/18 1429  BP: (!) 177/94 (!) 187/89 (!) 186/97 (!) 183/95  Pulse: 95 78 78 73  Resp: 19 18    Temp: 98.6 F (37 C) 98.8 F (37.1 C) 98.7 F (37.1 C)   TempSrc:  Oral Oral   SpO2: 100% 99% 98%   Weight:  171 lb 11.8 oz (77.9 kg)    Height:       Physical Exam  Constitutional: He is oriented to person, place, and time. He appears well-developed and well-nourished.  He is elderly and appears fatigued.  NG tube to low wall suction.  No acute distress  HENT:  Head: Normocephalic and atraumatic.  Eyes: Pupils are equal, round, and  reactive to light. EOM are normal.  Neck: Normal range of motion.  Cardiovascular: Normal rate, regular rhythm and normal heart sounds.  Pulmonary/Chest: Effort normal and breath sounds normal.  Abdominal: Soft. Bowel sounds are normal.  Neurological: He is alert and oriented to person, place, and time.  Skin: Skin is warm and dry.       CMP Latest Ref Rng & Units 04/26/2018  Glucose 70 - 99 mg/dL 147(H)  BUN 8 - 23 mg/dL 22  Creatinine 0.61 - 1.24 mg/dL 1.20  Sodium 135 - 145 mmol/L 145  Potassium 3.5 - 5.1 mmol/L 3.3(L)  Chloride 98 - 111 mmol/L 106  CO2 22 - 32 mmol/L 29  Calcium 8.9 - 10.3 mg/dL 8.4(L)  Total Protein 6.5 - 8.1 g/dL -  Total Bilirubin 0.3 - 1.2 mg/dL -  Alkaline Phos 38 - 126 U/L -  AST 15 - 41 U/L -  ALT 0 - 44 U/L -   CBC Latest Ref Rng & Units 04/26/2018  WBC 3.8 - 10.6 K/uL 7.9  Hemoglobin 13.0 - 18.0 g/dL 10.2(L)  Hematocrit 40.0 - 52.0 % 30.6(L)  Platelets 150 - 440 K/uL 234    @IMAGES @  Ct Abdomen Pelvis W Contrast  Result Date: 04/24/2018 CLINICAL DATA:  Vomiting. EXAM: CT ABDOMEN AND PELVIS WITH CONTRAST TECHNIQUE: Multidetector CT imaging of the abdomen and pelvis was performed using the standard protocol following bolus administration of intravenous contrast. CONTRAST:  182m OMNIPAQUE IOHEXOL 300 MG/ML  SOLN COMPARISON:  None. FINDINGS: Lower chest: Calcified granulomas are seen lung bases. There are several uncalcified nodules as well. Atelectasis adjacent to a large hiatal hernia. No pneumonia or aspiration. There is a large hiatal hernia which is fluid-filled and dilated. The hernia displaces the heart anteriorly. Coronary artery calcifications are noted. There appears to be adenopathy in the right hilum, incompletely evaluated. Hepatobiliary: There is a wedge-shaped region of decreased attenuation in the liver such as on series 2, image 21 which dissipates on delayed imaging consistent with a perfusion anomaly of no acute significance. There is a  mass in the right hepatic lobe on series 2, image 15 measuring 3.2 cm with an attenuation of 28 Hounsfield units.  This mass persists on delayed imaging. No other liver masses identified. The portal vein is not well assessed on initial imaging but is opacified on delayed imaging. No portal vein thrombosis noted. The gallbladder is normal. Pancreas: Pancreatic duct is mildly prominent but pancreas is otherwise normal. No evidence of pancreatitis or pancreatic mass. Spleen: Normal in size without focal abnormality. Adrenals/Urinary Tract: There is an indeterminate left adrenal nodule measuring 14 mm. There is a small right adrenal nodule which is indeterminate as well measuring 11 mm. There is a mass in the posterior right kidney on series 2, image 22 measuring up to 6 cm consistent with malignancy. There may be some subtle nodularity adjacent to the kidney as well on series 2, image 21. There is a more subtle region of decreased attenuation anteriorly in the right kidney on series 2, image 21. Another small malignancy is not excluded although this region is indeterminate. There are bilateral renal cysts. No hydronephrosis. No ureteral stones. The bladder is normal. Stomach/Bowel: The hiatal hernia which is quite large is distended and fluid-filled. The stomach become smaller in caliber as it passes into the abdomen as seen on series 2, image 19. The stomach below the diaphragm is twisted with the gastric outlet on the left as seen on series 2, image 18 and coronal image 33. The duodenum then makes 180 degree turn back to the right. The duodenum is decompressed. No small bowel obstruction. There is a stool ball in the rectum. The sigmoid colon extends into the upper abdomen between the peritoneum and the dilated stomach. No convincing evidence of sigmoid volvulus the remainder of the colon is unremarkable. Visualized appendix is unremarkable. Vascular/Lymphatic: The abdominal aorta is tortuous. The infrarenal abdominal  aorta measures 2.6 cm. Atherosclerotic changes extend from the abdominal aorta into the iliac and femoral vessels. No adenopathy in the abdomen. Reproductive: Brachytherapy seeds seen in the prostate. Other: There is a fat containing ventral hernia. No free air or free fluid. Musculoskeletal: Degenerative changes seen in the spine. IMPRESSION: 1. There is a gastric volvulus. There is clearly a volvulus centered at the gastric outlet which is located in the left upper quadrant. There is also a distended fluid-filled hiatal hernia. The stomach becomes smaller in caliber as the hernia extends through the hiatus but there is no definitive volvulus at the level of the hiatus. 2. 6 cm right renal carcinoma. There may be some mild nodularity adjacent to the left kidney in this region as well. A more subtle region anteriorly in the left kidney could represent complicated cystic change versus another tiny malignancy. MRI could better assess. 3. Apparent adenopathy in the right hilum. Given the right renal malignancy, the findings are concerning for metastatic disease but incompletely evaluated. 4. Multiple pulmonary nodules. Many are calcified consistent with previous granulomatous disease. Some are not calcified. I suspect the uncalcified nodules may simply represent uncalcified granulomas but metastatic disease would be difficult to completely exclude. 5. There is an indeterminate mass in the liver described above. This is not fill on in on delayed images and is not a typical hemangioma. It could represent a complicated cyst. However, metastatic lesion is not completely excluded. 6. Redundant sigmoid colon. The sigmoid colon is mildly distended between the dilated stomach an the peritoneum but there is no definitive volvulus in this region. 7. Indeterminate adrenal nodules. While adenomas are possible, given the malignancy in the kidney, metastatic disease should be considered. 8. Mildly tortuous atherosclerotic abdominal  aorta. Ectatic abdominal aorta at  risk for aneurysm development. Recommend followup by ultrasound in 5 years. This recommendation follows ACR consensus guidelines: White Paper of the ACR Incidental Findings Committee II on Vascular Findings. J Am Coll Radiol 2013; 10:789-794. The patient may benefit from a PET-CT to further evaluate the apparent adenopathy in the right hilum, the mass in the liver, in the adrenal nodularity. This could be done non emergently. Findings called to Dr. Archie Balboa. Electronically Signed   By: Dorise Bullion III M.D   On: 04/24/2018 21:23   US Carotid Bilateral  Result Date: 04/26/2018 CLINICAL DATA:  Syncopal episode. History of hypertension, hyperlipidemia and diabetes EXAM: BILATERAL CAROTID DUPLEX ULTRASOUND TECHNIQUE: Pearline Cables scale imaging, color Doppler and duplex ultrasound were performed of bilateral carotid and vertebral arteries in the neck. COMPARISON:  Carotid Doppler ultrasound - 05/15/2015 FINDINGS: Criteria: Quantification of carotid stenosis is based on velocity parameters that correlate the residual internal carotid diameter with NASCET-based stenosis levels, using the diameter of the distal internal carotid lumen as the denominator for stenosis measurement. The following velocity measurements were obtained: RIGHT ICA:  57/15 cm/sec CCA:  09/32 cm/sec SYSTOLIC ICA/CCA RATIO:  0.6 ECA:  60 cm/sec LEFT ICA:  76/20 cm/sec CCA:  671/24 cm/sec SYSTOLIC ICA/CCA RATIO:  0.7 ECA:  53 cm/sec RIGHT CAROTID ARTERY: There is a minimal amount of eccentric echogenic plaque within the right carotid bulb (image 17), extending to involve the origin and proximal aspects of the right internal carotid artery (image 25), not resulting in elevated peak systolic velocities within the interrogated course of the right internal carotid artery to suggest a hemodynamically significant stenosis. RIGHT VERTEBRAL ARTERY:  Antegrade flow LEFT CAROTID ARTERY: There is a minimal amount of scattered  atherosclerotic plaque seen throughout the left common carotid artery (images 41, 42, 45 and 46). There is a moderate amount of eccentric mixed echogenic plaque within the left carotid bulb (image 51 and 52), extending to involve the origin and proximal aspects of the left internal carotid artery (image 62), not resulting in elevated peak systolic velocities within the interrogated course of the left internal carotid artery to suggest a hemodynamically significant stenosis. LEFT VERTEBRAL ARTERY:  Antegrade flow IMPRESSION: Minimal to moderate amount of bilateral atherosclerotic plaque, left greater than right, not resulting in a hemodynamically significant stenosis within either internal carotid artery. Electronically Signed   By: Sandi Mariscal M.D.   On: 04/26/2018 12:09   Dg Chest Portable 1 View  Result Date: 04/24/2018 CLINICAL DATA:  Initial evaluation status post NG tube placement. EXAM: PORTABLE CHEST 1 VIEW COMPARISON:  Prior radiograph from 05/06/2015. FINDINGS: NG tube seen coursing inferiorly into the upper stomach with nonvisualization of the tip. Cardiomegaly, stable from previous. Mediastinal silhouette within normal limits. Aortic atherosclerosis. Lungs are hypoinflated. Small bilateral pleural effusions. Perihilar vascular congestion without frank pulmonary edema. No definite focal airspace disease. No pneumothorax. No acute osseus abnormality. IMPRESSION: 1. Enteric tube in place, seen coursing into the upper abdomen with nonvisualization of the tip. 2. Cardiomegaly with perihilar vascular congestion and probable small bilateral pleural effusions. 3. Aortic atherosclerosis. Electronically Signed   By: Jeannine Boga M.D.   On: 04/24/2018 22:57   Dg Abd Acute W/chest  Result Date: 04/26/2018 CLINICAL DATA:  Gastric outlet obstruction EXAM: DG ABDOMEN ACUTE W/ 1V CHEST COMPARISON:  CT abdomen 04/24/2018 FINDINGS: There is a nasogastric tube with the tip projecting over the stomach. There is  a hiatal hernia. There is relative decreased distension of the stomach compared with the prior CT  of the abdomen. No radiopaque calculi or other significant radiographic abnormality is seen. 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 The lungs are hyperinflated likely secondary to COPD. There is a calcified right lower lobe pulmonary nodule consistent with prior granulomatous disease. There is no focal parenchymal opacity. There is no pleural effusion or pneumothorax. There is stable cardiomegaly. There is posterior spinal fusion from L3 through S1. IMPRESSION: There is a nasogastric tube with the tip projecting over the stomach. There is relative decreased distension of the stomach compared with the prior CT of the abdomen. There is interval improved aeration of bilateral lungs. Electronically Signed   By: Kathreen Devoid   On: 04/26/2018 11:47    Assessment and plan- Patient is a 84 y.o. male admitted for recurrent episodes of syncope and gastric volvulus found to have right renal mass  I met with the patient and his daughter at the bedside.  Palliative care is also involved in the care of this patient.  Discussed that patient has 6 cm right renal mass consistent with renal cell carcinoma.  Per daughter patient is known to have renal mass which was about 4 cm in February 2019 and is being followed by [facility] oncology and urology.  CT abdomen also revealed the liver lesion which may be a complicated cyst versus metastatic disease and indeterminate adrenal nodules as well as mediastinal adenopathy.  Patient is known to have lung nodules and possible adenopathy per daughter which has been worked up at the [place] and patient is also undergone a lung biopsy at the [place] which did not reveal any malignancy.  At this time I would recommend that patient should get his  CT scan images uploaded on a disc and sent to the [facility] for comparison but he will be ultimately getting his care through urology and oncology.  At this time it  is not certain that he truly has metastatic renal cell carcinoma.  At some point patient may need MRI of the abdomen with and without contrast for better characterization of the liver lesion as well as the renal mass but this would be better done at the [place] when he has received all his care so far.  Patient has refused surgery for his renal cell carcinoma in the past.     The bigger issue in his case is gastric volvulus which seems to be affecting his quality of life and his ability to eat.  I do not feel that renal cell carcinoma especially if it is nonmetastatic to be factored in significantly in the management of his gastric volvulus.  Briefly discussed TPN and venting PEG versus definitive surgical management of gastric volvulus that would enable him to eat.  However whether he would be a surgical candidate or not and if the patient wants to undergo such as surgery would be up to the discretion of the surgical team and the patient.  Appreciate palliative care input in assisting him with goals of care   Thank you for this kind referral and the opportunity to participate in the care of this patient   Visit Diagnosis 1. Syncope, unspecified syncope type   2. Dehydration   3. Gastric volvulus   4. Gastric outlet obstruction   5. Syncope     Dr. Randa Evens, MD, MPH [facility] [phone] 04/26/2018 4:25 PM

## 2018-04-26 NOTE — Progress Notes (Addendum)
[facility] [place] NAME: Keith Davila    MR#:  [id number]  DATE OF BIRTH:  08-Mar-1929  SUBJECTIVE:  admitted for acute on chronic gastric volvulus, getting NG tube to intermittent suction.  Patient denies abdominal pain.  Patient's daughter at bedside, she mentioned that he had episodes of syncopal events and passed out at home for about 15 minutes, and patient seen by EMS and then he passed out again for EMS because of recurrent syncopal events patient noted to have orthostatic hypotension by EMS.  Brought in because of syncopal event, patient was given big sandwich tray which she ate after that he vomited, patient's daughter is very unhappy with the cause of delay in the care in the emergency room getting CT scan of his abdomen even though she asked her multiple times.  CHIEF COMPLAINT:   Chief Complaint  Patient presents with  . Loss of Consciousness  Patient has big hiatal hernia, gastric volvulus for long time usually patient is careful with his food intake.  Patient now has acute on chronic gastric volvulus.  Denies abdominal pain.  Patient has known history of right renal cell carcinoma status post biopsy by his right kidney mass in February 2019 as per discussion with patient's daughter.  Right kidney tumor was 4 cm in size in February patient also has spots in the lungs and she says that patient has sarcoidosis.  Patient was told unless he gets symptoms there is no need to treat and patient does not want any any invasive intervention like right kidney surgery.  Patients daughter does not know the stage of kidney cancer. Patient told me that because of his advanced age he does not want any chemotherapy.  I spoke with Dr. Robie Ridge who will see the patient around 3:30 PM and she suggested that patient can follow-up with his oncologist as an outpatient and because he already has a lot of blood work, imaging done it would be better  for him to  have continue to have care with his primary oncologist.  REVIEW OF SYSTEMS:   ROS CONSTITUTIONAL: No fever, fatigue or weakness.  EYES: No blurred or double vision.  EARS, NOSE, AND THROAT: No tinnitus or ear pain.  RESPIRATORY: Npatient has mild cough.  But no shortness of breath cARDIOVASCULAR: No chest pain, orthopnea, edema.  GASTROINTESTINAL: No nausea, vomiting, diarrhea or abdominal pain.  GENITOURINARY: No dysuria, hematuria.  ENDOCRINE: No polyuria, nocturia,  HEMATOLOGY: No anemia, easy bruising or bleeding SKIN: No rash or lesion. MUSCULOSKELETAL: No joint pain or arthritis.   NEUROLOGIC: No tingling, numbness, weakness.  PSYCHIATRY: No anxiety or depression.   DRUG ALLERGIES:   Allergies  Allergen Reactions  . Prevacid [Lansoprazole]     VITALS:  Blood pressure (!) 187/89, pulse 78, temperature 98.8 F (37.1 C), temperature source Oral, resp. rate 18, height 6\' 2"  (1.88 m), weight 77.9 kg (171 lb 11.8 oz), SpO2 99 %.  PHYSICAL EXAMINATION:  GENERAL:  84 y.o.-year-old patient lying in the bed with no acute distress. Ng tube noted . EYES: Pupils equal, round, reactive to light and accommodation. No scleral icterus. Extraocular muscles intact.  HEENT: Head atraumatic, normocephalic. Oropharynx and nasopharynx clear.  NECK:  Supple, no jugular venous distention. No thyroid enlargement, no tenderness.  LUNGS: Normal breath sounds bilaterally, no wheezing, rales,rhonchi or crepitation. No use of accessory muscles of respiration.  CARDIOVASCULAR: S1, S2 normal. No murmurs, rubs, or gallops.  ABDOMEN: Soft, nontender, nondistended. Bowel  sounds present. No organomegaly or mass.  EXTREMITIES: No pedal edema, cyanosis, or clubbing.  NEUROLOGIC: Cranial nerves II through XII are intact. Muscle strength 5/5 in all extremities. Sensation intact. Gait not checked.  PSYCHIATRIC: The patient is alert and oriented x 3.  SKIN: No obvious rash, lesion, or ulcer.    LABORATORY  PANEL:   CBC Recent Labs  Lab 04/26/18 0843  WBC 7.9  HGB 10.2*  HCT 30.6*  PLT 234   ------------------------------------------------------------------------------------------------------------------  Chemistries  Recent Labs  Lab 04/24/18 1209  04/26/18 0843  NA 141   < > 145  K 4.0   < > 3.3*  CL 106   < > 106  CO2 27   < > 29  GLUCOSE 84   < > 147*  BUN 29*   < > 22  CREATININE 1.23   < > 1.20  CALCIUM 9.1   < > 8.4*  AST 23  --   --   ALT 12  --   --   ALKPHOS 47  --   --   BILITOT 0.7  --   --    < > = values in this interval not displayed.   ------------------------------------------------------------------------------------------------------------------  Cardiac Enzymes Recent Labs  Lab 04/25/18 0407  TROPONINI <0.03   ------------------------------------------------------------------------------------------------------------------  RADIOLOGY:  Ct Abdomen Pelvis W Contrast  Result Date: 04/24/2018 CLINICAL DATA:  Vomiting. EXAM: CT ABDOMEN AND PELVIS WITH CONTRAST TECHNIQUE: Multidetector CT imaging of the abdomen and pelvis was performed using the standard protocol following bolus administration of intravenous contrast. CONTRAST:  138mL OMNIPAQUE IOHEXOL 300 MG/ML  SOLN COMPARISON:  None. FINDINGS: Lower chest: Calcified granulomas are seen lung bases. There are several uncalcified nodules as well. Atelectasis adjacent to a large hiatal hernia. No pneumonia or aspiration. There is a large hiatal hernia which is fluid-filled and dilated. The hernia displaces the heart anteriorly. Coronary artery calcifications are noted. There appears to be adenopathy in the right hilum, incompletely evaluated. Hepatobiliary: There is a wedge-shaped region of decreased attenuation in the liver such as on series 2, image 21 which dissipates on delayed imaging consistent with a perfusion anomaly of no acute significance. There is a mass in the right hepatic lobe on series 2, image 15  measuring 3.2 cm with an attenuation of 28 Hounsfield units. This mass persists on delayed imaging. No other liver masses identified. The portal vein is not well assessed on initial imaging but is opacified on delayed imaging. No portal vein thrombosis noted. The gallbladder is normal. Pancreas: Pancreatic duct is mildly prominent but pancreas is otherwise normal. No evidence of pancreatitis or pancreatic mass. Spleen: Normal in size without focal abnormality. Adrenals/Urinary Tract: There is an indeterminate left adrenal nodule measuring 14 mm. There is a small right adrenal nodule which is indeterminate as well measuring 11 mm. There is a mass in the posterior right kidney on series 2, image 22 measuring up to 6 cm consistent with malignancy. There may be some subtle nodularity adjacent to the kidney as well on series 2, image 21. There is a more subtle region of decreased attenuation anteriorly in the right kidney on series 2, image 21. Another small malignancy is not excluded although this region is indeterminate. There are bilateral renal cysts. No hydronephrosis. No ureteral stones. The bladder is normal. Stomach/Bowel: The hiatal hernia which is quite large is distended and fluid-filled. The stomach become smaller in caliber as it passes into the abdomen as seen on series 2, image 19. The  stomach below the diaphragm is twisted with the gastric outlet on the left as seen on series 2, image 18 and coronal image 33. The duodenum then makes 180 degree turn back to the right. The duodenum is decompressed. No small bowel obstruction. There is a stool ball in the rectum. The sigmoid colon extends into the upper abdomen between the peritoneum and the dilated stomach. No convincing evidence of sigmoid volvulus the remainder of the colon is unremarkable. Visualized appendix is unremarkable. Vascular/Lymphatic: The abdominal aorta is tortuous. The infrarenal abdominal aorta measures 2.6 cm. Atherosclerotic changes  extend from the abdominal aorta into the iliac and femoral vessels. No adenopathy in the abdomen. Reproductive: Brachytherapy seeds seen in the prostate. Other: There is a fat containing ventral hernia. No free air or free fluid. Musculoskeletal: Degenerative changes seen in the spine. IMPRESSION: 1. There is a gastric volvulus. There is clearly a volvulus centered at the gastric outlet which is located in the left upper quadrant. There is also a distended fluid-filled hiatal hernia. The stomach becomes smaller in caliber as the hernia extends through the hiatus but there is no definitive volvulus at the level of the hiatus. 2. 6 cm right renal carcinoma. There may be some mild nodularity adjacent to the left kidney in this region as well. A more subtle region anteriorly in the left kidney could represent complicated cystic change versus another tiny malignancy. MRI could better assess. 3. Apparent adenopathy in the right hilum. Given the right renal malignancy, the findings are concerning for metastatic disease but incompletely evaluated. 4. Multiple pulmonary nodules. Many are calcified consistent with previous granulomatous disease. Some are not calcified. I suspect the uncalcified nodules may simply represent uncalcified granulomas but metastatic disease would be difficult to completely exclude. 5. There is an indeterminate mass in the liver described above. This is not fill on in on delayed images and is not a typical hemangioma. It could represent a complicated cyst. However, metastatic lesion is not completely excluded. 6. Redundant sigmoid colon. The sigmoid colon is mildly distended between the dilated stomach an the peritoneum but there is no definitive volvulus in this region. 7. Indeterminate adrenal nodules. While adenomas are possible, given the malignancy in the kidney, metastatic disease should be considered. 8. Mildly tortuous atherosclerotic abdominal aorta. Ectatic abdominal aorta at risk for  aneurysm development. Recommend followup by ultrasound in 5 years. This recommendation follows ACR consensus guidelines: White Paper of the ACR Incidental Findings Committee II on Vascular Findings. J Am Coll Radiol 2013; 10:789-794. The patient may benefit from a PET-CT to further evaluate the apparent adenopathy in the right hilum, the mass in the liver, in the adrenal nodularity. This could be done non emergently. Findings called to Dr. Archie Balboa. Electronically Signed   By: Dorise Bullion III M.D   On: 04/24/2018 21:23   Dg Chest Portable 1 View  Result Date: 04/24/2018 CLINICAL DATA:  Initial evaluation status post NG tube placement. EXAM: PORTABLE CHEST 1 VIEW COMPARISON:  Prior radiograph from 05/06/2015. FINDINGS: NG tube seen coursing inferiorly into the upper stomach with nonvisualization of the tip. Cardiomegaly, stable from previous. Mediastinal silhouette within normal limits. Aortic atherosclerosis. Lungs are hypoinflated. Small bilateral pleural effusions. Perihilar vascular congestion without frank pulmonary edema. No definite focal airspace disease. No pneumothorax. No acute osseus abnormality. IMPRESSION: 1. Enteric tube in place, seen coursing into the upper abdomen with nonvisualization of the tip. 2. Cardiomegaly with perihilar vascular congestion and probable small bilateral pleural effusions. 3. Aortic atherosclerosis. Electronically  Signed   By: Jeannine Boga M.D.   On: 04/24/2018 22:57    EKG:   Orders placed or performed during the hospital encounter of 04/24/18  . ED EKG  . ED EKG  . EKG 12-Lead  . EKG 12-Lead    ASSESSMENT AND PLAN:   1. recurrent syncope likely vasovagal with orthostatic hypotension: Check echocardiogram, carotid ultrasound, hold losartan. 2.  Hypokalemia, acute renal failure: Continue IV fluids and potassium supplementation. 3.  Acute on chronic gastric volvulus: Patient has history of large hiatal hernia, chronic gastric volvulus, has been  having vomiting, abdominal pain, has acute gastric volvulus, has NG tube tube decompression, seen by surgery today wants serial x-rays.  Dr. Tama High told me that patient can have palliative foods mycotic candy and ice chips and likely removal of NG tube for pleasure feeds.  Patient and patient's daughter know that patient has this v gastric volvulus for long time and does not want any invasive intervention or TPN. 4.  Known history of right kidney cancer, possible mets to lungs but patient daughter mentioned that he has had a biopsy done at [facility] and known history of sarcoidosis though this spots in the lungs are present before.  I spoke with Dr. Randa Evens who suggested that patient already had a biopsy at around [facility] along with more test like CT chest, patient better served at more testing and continued care at [facility] rather than more testing here and daughter agrees for that.  So my plan is to do work-up for syncope, take off the NG tube as to start the feeding and then discharge him home in next 1 to 2 days, patient daughter agrees for this plan. #5 .diabetes mellitus type 2: Continue sliding scale insulin with coverage.  Hold Lantus. #6 .hypokalemia: Replace the potassium.  all the records are reviewed and case discussed with Care Management/Social Workerr. Management plans discussed with the patient, family and they are in agreement.  CODE STATUS: DNR  TOTAL TIME TAKING CARE OF THIS PATIENT: 38 minutes.  More than 50% time spent in counseling, coordination of care, discussing about the blood work that he had done at [facility] including right kidney biopsy, imaging, also discussing with Dr. Randa Evens from oncology and also I discussed extensively with patient's daughter. POSSIBLE D/C IN 1-2 DAYS, DEPENDING ON CLINICAL CONDITION.   Epifanio Lesches M.D on 04/26/2018 at 10:05 AM  Between 7am to 6pm - Pager - [phone]  After 6pm go to [email or web address] - password [email or web address] [facility]  Office  [phone]  CC: Primary care physician; Desiree Hane, PA   Note: This dictation was prepared with Dragon dictation along with smaller phrase technology. Any transcriptional errors that result from this process are unintentional.

## 2018-04-26 NOTE — Progress Notes (Signed)
Pt seen and examined. HE is 88 with multiple co morbidities presented with syncope and Chronic gastric volvulus. HE is DNR / DNI. HE lives with his daughter and has decrease mobility, walks with a walker for only a few yards, has some DOE. Recently diagnosed with a large renal  carcinoma with metastatic disease. Have personal review his CT scan showing evidence of a gastric volvulus.  There is no evidence of ischemia within the gastric wall.  No free air.  He also had an endoscopy last year by Dr. Vira Agar that I have also personally review and I can actually see the chronic volvulus. He also admits chronic dysphagia and difficulty swallowing. Since admission IV hydration and NG tube decompression his symptoms have completely resolved and he feels much better. I have order a CBC this morning and is unremarkable.  His BMP that I have also ordered show improvement in his creatinine to 1.2 no evidence of acidosis.  PE Elderly male in NAD Abd: soft, NT, midly distended, no peritonitis  A/P Chronic gastric volvulus debilitated male that poses a challenging management issues.  Currently there is no evidence of ischemia within the stomach wall.   Given his metastatic cancer, fragility and advanced age any surgical intervention will pose significant morbidity and significant mortality.  Patient currently is not interested in any surgical intervention at this time. I have ordered an x-ray to see the status of his chronic volvulus.  Continue NG tube for now.  May need TPN at some point in time. I Spent at least 35 minutes in this encounter with the majority of time spent in coordination and counseling of his care. We will continue to follow

## 2018-04-26 NOTE — Care Management (Signed)
RNCM notified that patient would now like to seek transfer to the [place]. RNCM previously had faxed signed refusal of transfer sheet to the [place]. RNCM has begun paperwork but it requires a MD signature. Due to the late time MD is not available to sign the transfer paperwork. MD aware that a signature is needed tomorrow. I have prepared some of the necessary documents for this transfer and will pass it along to the [facility] who will be covering this unit tomorrow.

## 2018-04-26 NOTE — Care Management Note (Addendum)
Case Management Note  Patient Details  Name: Keith Davila MRN: [id number] Date of Birth: 1929-05-16  Subjective/Objective:  Patient seen by [facility] team since patient has VA benefits. Patient also has [facility] in place but does confirm he occasionally follows with the [facility] but also sees Dr Sabra Heck with [facility] for his primary services. VA trasnfer refusal form signed and faxed to [place].The patient lives with daughter [name] [phone] who is his POA. Patient also has personal care sitter who come out to the home 5 days a week to assist with activities of daily living and household tasks. Patient has a walker, wheelchair, electric scooter and an entry ramp into the home. Patient reports no recent falls. Family provides transport. Patient has been involved in home health ser vies as well as rehab facilities in the past.                Action/Plan:  RNCM to continue to follow for any needs.  Expected Discharge Date:                  Expected Discharge Plan:     In-House Referral:     Discharge planning Services     Post Acute Care Choice:    Choice offered to:     DME Arranged:    DME Agency:     HH Arranged:    HH Agency:     Status of Service:     If discussed at [employer], dates discussed:    Additional Comments:  Kathyrn Drown Vivian Neuwirth, RN 04/26/2018, 8:14 AM

## 2018-04-26 NOTE — Consult Note (Signed)
Consultation Note Date: 04/26/2018   Patient Name: Keith Davila  DOB: 1929-08-04  MRN: [id number]  Age / Sex: 84 y.o., male  PCP: Desiree Hane, [place] Referring Physician: Epifanio Lesches, MD  Reason for Consultation: Establishing goals of care and Psychosocial/spiritual support  HPI/Patient Profile: 84 y.o. male  admitted on 04/24/2018 with PMH  of anemia of chronic disease, arthritis, prostate cancer, COPD, diabetes type 2.  Patient was also noted to have a right renal mass which is followed by [facility] oncology.   Patient has all his medical needs delivered at the [place] in [place].  He is also known to have lung nodules and mediastinal adenopathy which has been worked up at the [place] and patient underwent a lung biopsy at the [place] according to his daughter which did not reveal any malignancy.  She speaks to a diagnosis of sarcoidosis   Patient was admitted through the ER with symptoms of abdominal pain and a syncopal episode  He underwent CT abdomen pelvis with contrast on 04/24/2018 which showed: 6 cm right renal cell carcinoma.  Noted adenopathy in the right hilum.  Multiple pulmonary nodules.  Indeterminate mass about 3.2 cm in the liver it could represent a complicated cyst.  However metastatic lesion is not completely excluded.  Indeterminate adrenal nodules.  While adenomas a possible given the malignancy and the kidney metastatic disease should also be considered.    Gastric volvulus was  Noted.  This has been a chronic issue Currently patient has an NG tube to suction   Discussed with patient and family the significance of a persons ability to support themselves both nutritionally and from a  from a hydration standpoint as it relates to both treatment options,   prognosis and quality of life.      Surgery has been consulted and have recommended conservative management at this time.  Patient speaks to not  wanting any aggressive treatmetns or surgical interventions but is unable to to make a shift to a more supportive approach.  Patient and his family face treatment option decisions, advanced directive decisions and anticipatory care needs.   Clinical Assessment and Goals of Care:  This NP Wadie Lessen reviewed medical records, received report from team, assessed the patient and then meet at the patient's bedside along with his daughter and grand-daugher to discuss diagnosis, prognosis, GOC, EOL wishes disposition and options.  Concept of Hospice and Palliative Care were discussed  A detailed discussion was had today regarding advanced directives.  Concepts specific to code status, artifical feeding and hydration, continued IV antibiotics and rehospitalization was had.    The difference between a aggressive medical intervention path  and a palliative comfort care path for this patient at this time was had.  Values and goals of care important to patient and family were attempted to be elicited.  Questions and concerns addressed.   Family encouraged to call with questions or concerns.    PMT will continue to support holistically.  Documented HPOA is daughter Keith Davila. Copy placed in hard chart  SUMMARY OF RECOMMENDATIONS    Code Status/Advance Care Planning:  DNR   Palliative Prophylaxis:   Pain assessment, delirium prevention   Additional Recommendations (Limitations, Scope, Preferences):  At this time patient/family have made no clear, decisive decisions regarding treatment plan.  After conversation, family and patient verbalize their strong desire to transfer to the [facility] hospital for ongoing medical treatment and disposition planning.   Psycho-social/Spiritual:   Desire for further Chaplaincy support: yes will consult Spiritual care  Additional Recommendations: Education on hospice benefit, depending on outcomes/decisions may be eligible  Prognosis:   Unable to  determine, will depends on decisions for life prolonging measures  Discharge Planning: Transfer to [facility] hospital for ongoing treatment plan     Primary Diagnoses: Present on Admission: . Gastric volvulus   I have reviewed the medical record, interviewed the patient and family, and examined the patient. The following aspects are pertinent.  Past Medical History:  Diagnosis Date  . Anemia   . Arthritis   . Cancer [facility])    prostate  . COPD (chronic obstructive pulmonary disease) ([place])   . Diabetes mellitus without complication ([place])   . GERD (gastroesophageal reflux disease)   . Hyperlipidemia   . Hypertension   . MRSA (methicillin resistant staph aureus) culture positive 2009  . Peripheral vascular disease ([place])   . Pneumonia   . prostate   . Renal disorder   . Varicose veins    Social History   Socioeconomic History  . Marital status: Widowed    Spouse name: Not on file  . Number of children: Not on file  . Years of education: Not on file  . Highest education level: Not on file  Occupational History  . Not on file  Social Needs  . Financial resource strain: Not on file  . Food insecurity:    Worry: Not on file    Inability: Not on file  . Transportation needs:    Medical: Not on file    Non-medical: Not on file  Tobacco Use  . Smoking status: Former Research scientist (life sciences)  . Smokeless tobacco: Never Used  Substance and Sexual Activity  . Alcohol use: No  . Drug use: No  . Sexual activity: Not on file  Lifestyle  . Physical activity:    Days per week: Not on file    Minutes per session: Not on file  . Stress: Not on file  Relationships  . Social connections:    Talks on phone: Not on file    Gets together: Not on file    Attends religious service: Not on file    Active member of club or organization: Not on file    Attends meetings of clubs or organizations: Not on file    Relationship status: Not on file  Other Topics Concern  . Not on file  Social History  Narrative  . Not on file   Family History  Problem Relation Age of Onset  . Diabetes Other   . Diabetes Mother   . Heart attack Father    Scheduled Meds: . aspirin EC  81 mg Oral Daily  . docusate sodium  100 mg Oral BID  . heparin  5,000 Units Subcutaneous Q8H  . insulin aspart  0-5 Units Subcutaneous QHS  . insulin aspart  0-9 Units Subcutaneous TID WC  . latanoprost  1 drop Both Eyes QHS  . tamsulosin  0.4 mg Oral QPM  . timolol  1 drop Right Eye Daily   Continuous Infusions: .  0.9 % NaCl with KCl 20 mEq / L 50 mL/hr at 04/26/18 1214   PRN Meds:.acetaminophen **OR** acetaminophen, albuterol, bisacodyl, hydrALAZINE, HYDROcodone-acetaminophen, ondansetron **OR** ondansetron (ZOFRAN) IV, traZODone Medications Prior to Admission:  Prior to Admission medications   Medication Sig Start Date End Date Taking? Authorizing Provider  acetaminophen (TYLENOL) 325 MG tablet Take 650 mg by mouth every 4 (four) hours as needed. for pain/ increased temp. May be administered orally, per G-tube if needed or rectally if unable to swallow (separate order). Maximum dose for 24 hours is 3,000 mg from all sources of Acetaminophen/ Tylenol   Yes [provider]  albuterol (PROVENTIL HFA;VENTOLIN HFA) 108 (90 Base) MCG/ACT inhaler Inhale 2 puffs into the lungs 3 (three) times daily as needed for wheezing or shortness of breath.   Yes [provider]  aspirin 81 MG tablet Take 81 mg by mouth daily.   Yes [provider]  Brinzolamide-Brimonidine (SIMBRINZA) 1-0.2 % SUSP Place 1 drop into the right eye 3 (three) times daily.   Yes [provider]  Cinnamon 500 MG capsule Take 1,000 mg by mouth 2 (two) times daily.   Yes [provider]  ferrous sulfate 325 (65 FE) MG tablet Take 325 mg by mouth 2 (two) times daily.    Yes [provider]  insulin aspart (NOVOLOG) 100 UNIT/ML injection Inject 8u every morning with breakfast, 10u daily with lunch and 8u with  dinner   Yes [provider]  insulin glargine (LANTUS) 100 unit/mL SOPN Inject 14 Units into the skin every evening.    Yes [provider]  latanoprost (XALATAN) 0.005 % ophthalmic solution Place 1 drop into both eyes at bedtime.    Yes [provider]  losartan (COZAAR) 25 MG tablet Take 1 tablet (25 mg total) by mouth daily. 02/11/17  Yes Croitoru, Mihai, MD  senna (SENOKOT) 8.6 MG TABS tablet Take 1 tablet by mouth every morning.   Yes [provider]  simvastatin (ZOCOR) 40 MG tablet Take 40 mg by mouth every evening.    Yes [provider]  tamsulosin (FLOMAX) 0.4 MG CAPS capsule Take 0.4 mg by mouth every evening.    Yes [provider]  timolol (BETIMOL) 0.5 % ophthalmic solution Place 1 drop into the right eye daily.    Yes [provider]  vitamin B-12 (CYANOCOBALAMIN) 1000 MCG tablet Take 1,000 mcg by mouth daily.   Yes [provider]  vitamin C (ASCORBIC ACID) 500 MG tablet Take 500 mg by mouth 2 (two) times daily.   Yes [provider]   Allergies  Allergen Reactions  . Prevacid [Lansoprazole]    Review of Systems  Constitutional: Positive for fatigue.  Gastrointestinal: Positive for nausea.  Neurological: Positive for weakness.    Physical Exam  Constitutional: He is oriented to person, place, and time. He appears cachectic. He appears ill.  HENT:  -noted NG tube to suction   Cardiovascular: Normal rate, regular rhythm and normal heart sounds.  Pulmonary/Chest: Effort normal and breath sounds normal.  Neurological: He is alert and oriented to person, place, and time.  Skin: Skin is warm and dry.    Vital Signs: BP (!) 183/95   Pulse 73   Temp 98.7 F (37.1 C) (Oral)   Resp 18   Ht 6\' 2"  (1.88 m)   Wt 77.9 kg (171 lb 11.8 oz)   SpO2 98%   BMI 22.05 kg/m  Pain Scale: 0-10   Pain Score: 0-No pain  SpO2: SpO2: 98 % O2 Device:SpO2: 98 % O2 Flow Rate: .   IO: Intake/output  summary:   Intake/Output Summary (Last 24 hours) at 04/26/2018 1526 Last data filed at 04/26/2018 1424 Gross per 24 hour  Intake 2322.5 ml  Output 4200 ml  Net -1877.5 ml    LBM: Last BM Date: 04/23/18 Baseline Weight: Weight: 83.9 kg (185 lb) Most recent weight: Weight: 77.9 kg (171 lb 11.8 oz)     Palliative Assessment/Data: 20 %    Discussed with Dr Janese Banks and Dr Vianne Bulls and Merrily Pew CM-RN  Time In: 1545 Time Out: 1700 Time Total: 75 minutes Greater than 50%  of this time was spent counseling and coordinating care related to the above assessment and plan.  Signed by: Wadie Lessen, NP   Please contact Palliative Medicine Team phone at [phone] for questions and concerns.  For individual provider: See Shea Evans

## 2018-04-27 ENCOUNTER — Inpatient Hospital Stay: Payer: Medicare Other

## 2018-04-27 DIAGNOSIS — R531 Weakness: Secondary | ICD-10-CM

## 2018-04-27 LAB — GLUCOSE, CAPILLARY
GLUCOSE-CAPILLARY: 145 mg/dL — AB (ref 70–99)
GLUCOSE-CAPILLARY: 127 mg/dL — AB (ref 70–99)
Glucose-Capillary: 136 mg/dL — ABNORMAL HIGH (ref 70–99)
Glucose-Capillary: 259 mg/dL — ABNORMAL HIGH (ref 70–99)

## 2018-04-27 LAB — CBC
HEMATOCRIT: 32.5 % — AB (ref 40.0–52.0)
HEMOGLOBIN: 10.7 g/dL — AB (ref 13.0–18.0)
MCH: 30.1 pg (ref 26.0–34.0)
MCHC: 33.1 g/dL (ref 32.0–36.0)
MCV: 91 fL (ref 80.0–100.0)
Platelets: 265 10*3/uL (ref 150–440)
RBC: 3.57 MIL/uL — ABNORMAL LOW (ref 4.40–5.90)
RDW: 16.5 % — ABNORMAL HIGH (ref 11.5–14.5)
WBC: 8.9 10*3/uL (ref 3.8–10.6)

## 2018-04-27 LAB — BASIC METABOLIC PANEL
Anion gap: 14 (ref 5–15)
BUN: 21 mg/dL (ref 8–23)
CHLORIDE: 106 mmol/L (ref 98–111)
CO2: 26 mmol/L (ref 22–32)
CREATININE: 1.11 mg/dL (ref 0.61–1.24)
Calcium: 8.5 mg/dL — ABNORMAL LOW (ref 8.9–10.3)
GFR, EST NON AFRICAN AMERICAN: 58 mL/min — AB (ref >60)
Glucose, Bld: 161 mg/dL — ABNORMAL HIGH (ref 70–99)
POTASSIUM: 3.3 mmol/L — AB (ref 3.5–5.1)
Sodium: 146 mmol/L — ABNORMAL HIGH (ref 135–145)

## 2018-04-27 MED ORDER — POTASSIUM CHLORIDE 10 MEQ/100ML IV SOLN
10.0000 meq | INTRAVENOUS | Status: AC
  Administered 2018-04-27 (×2): 10 meq via INTRAVENOUS
  Filled 2018-04-27 (×2): qty 100

## 2018-04-27 NOTE — Care Management (Signed)
Spoke with Keith Davila at the [facility]. She states they have been on diversion but are now not. She is now working on patients transfer. She will call RNCM once she knows more. Text sent to Dr. Vianne Bulls.

## 2018-04-27 NOTE — Progress Notes (Signed)
[place] at [place] NAME: Keith Davila    MR#:  [id number]  DATE OF BIRTH:  [birth year]  SUBJECTIVE: denies abdominal pain. Wants to be  transferred to [facility] hospital.  CHIEF COMPLAINT:   Chief Complaint  Patient presents with  . Loss of Consciousness  Patient has big hiatal hernia, gastric volvulus for long time usually patient is careful with his food intake.  Patient now has acute on chronic gastric volvulus.  Denies abdominal pain.    REVIEW OF SYSTEMS:   ROS CONSTITUTIONAL: No fever, fatigue or weakness.  EYES: No blurred or double vision.  EARS, NOSE, AND THROAT: No tinnitus or ear pain.  RESPIRATORY: Npatient has mild cough.  But no shortness of breath cARDIOVASCULAR: No chest pain, orthopnea, edema.  GASTROINTESTINAL: No nausea, vomiting, diarrhea or abdominal pain.  GENITOURINARY: No dysuria, hematuria.  ENDOCRINE: No polyuria, nocturia,  HEMATOLOGY: No anemia, easy bruising or bleeding SKIN: No rash or lesion. MUSCULOSKELETAL: No joint pain or arthritis.   NEUROLOGIC: No tingling, numbness, weakness.  PSYCHIATRY: No anxiety or depression.   DRUG ALLERGIES:   Allergies  Allergen Reactions  . Prevacid [Lansoprazole]     VITALS:  Blood pressure (!) 159/88, pulse 85, temperature 98.3 F (36.8 C), temperature source Oral, resp. rate 18, height 6\' 2"  (1.88 m), weight 77.9 kg (171 lb 11.8 oz), SpO2 100 %.  PHYSICAL EXAMINATION:  GENERAL:  84 y.o.-year-old patient lying in the bed with no acute distress. Ng tube noted . EYES: Pupils equal, round, reactive to light and accommodation. No scleral icterus. Extraocular muscles intact.  HEENT: Head atraumatic, normocephalic. Oropharynx and nasopharynx clear.  NECK:  Supple, no jugular venous distention. No thyroid enlargement, no tenderness.  LUNGS: Normal breath sounds bilaterally, no wheezing, rales,rhonchi or crepitation. No use of accessory muscles of respiration.   CARDIOVASCULAR: S1, S2 normal. No murmurs, rubs, or gallops.  ABDOMEN: Soft, nontender, nondistended. Bowel sounds present. No organomegaly or mass.  EXTREMITIES: No pedal edema, cyanosis, or clubbing.  NEUROLOGIC: Cranial nerves II through XII are intact. Muscle strength 5/5 in all extremities. Sensation intact. Gait not checked.  PSYCHIATRIC: The patient is alert and oriented x 3.  SKIN: No obvious rash, lesion, or ulcer.    LABORATORY PANEL:   CBC Recent Labs  Lab 04/27/18 0457  WBC 8.9  HGB 10.7*  HCT 32.5*  PLT 265   ------------------------------------------------------------------------------------------------------------------  Chemistries  Recent Labs  Lab 04/24/18 1209  04/27/18 0457  NA 141   < > 146*  K 4.0   < > 3.3*  CL 106   < > 106  CO2 27   < > 26  GLUCOSE 84   < > 161*  BUN 29*   < > 21  CREATININE 1.23   < > 1.11  CALCIUM 9.1   < > 8.5*  AST 23  --   --   ALT 12  --   --   ALKPHOS 47  --   --   BILITOT 0.7  --   --    < > = values in this interval not displayed.   ------------------------------------------------------------------------------------------------------------------  Cardiac Enzymes Recent Labs  Lab 04/25/18 0407  TROPONINI <0.03   ------------------------------------------------------------------------------------------------------------------  RADIOLOGY:  US Carotid Bilateral  Result Date: 04/26/2018 CLINICAL DATA:  Syncopal episode. History of hypertension, hyperlipidemia and diabetes EXAM: BILATERAL CAROTID DUPLEX ULTRASOUND TECHNIQUE: Pearline Cables scale imaging, color Doppler and duplex ultrasound were performed of bilateral carotid and vertebral arteries in  the neck. COMPARISON:  Carotid Doppler ultrasound - 05/15/2015 FINDINGS: Criteria: Quantification of carotid stenosis is based on velocity parameters that correlate the residual internal carotid diameter with NASCET-based stenosis levels, using the diameter of the distal internal  carotid lumen as the denominator for stenosis measurement. The following velocity measurements were obtained: RIGHT ICA:  57/15 cm/sec CCA:  29/51 cm/sec SYSTOLIC ICA/CCA RATIO:  0.6 ECA:  60 cm/sec LEFT ICA:  76/20 cm/sec CCA:  884/16 cm/sec SYSTOLIC ICA/CCA RATIO:  0.7 ECA:  53 cm/sec RIGHT CAROTID ARTERY: There is a minimal amount of eccentric echogenic plaque within the right carotid bulb (image 17), extending to involve the origin and proximal aspects of the right internal carotid artery (image 25), not resulting in elevated peak systolic velocities within the interrogated course of the right internal carotid artery to suggest a hemodynamically significant stenosis. RIGHT VERTEBRAL ARTERY:  Antegrade flow LEFT CAROTID ARTERY: There is a minimal amount of scattered atherosclerotic plaque seen throughout the left common carotid artery (images 41, 42, 45 and 46). There is a moderate amount of eccentric mixed echogenic plaque within the left carotid bulb (image 51 and 52), extending to involve the origin and proximal aspects of the left internal carotid artery (image 62), not resulting in elevated peak systolic velocities within the interrogated course of the left internal carotid artery to suggest a hemodynamically significant stenosis. LEFT VERTEBRAL ARTERY:  Antegrade flow IMPRESSION: Minimal to moderate amount of bilateral atherosclerotic plaque, left greater than right, not resulting in a hemodynamically significant stenosis within either internal carotid artery. Electronically Signed   By: Sandi Mariscal M.D.   On: 04/26/2018 12:09   Dg Abd Acute W/chest  Result Date: 04/27/2018 CLINICAL DATA:  Gastric outlet obstruction. EXAM: DG ABDOMEN ACUTE W/ 1V CHEST COMPARISON:  Radiographs of April 26, 2018. FINDINGS: Stable cardiomegaly. Atherosclerosis of thoracic aorta is noted. No pneumothorax is noted. Left lung is clear. Mild right basilar atelectasis is noted. Distal tip of nasogastric tube is seen in the  stomach. No evidence of bowel obstruction or ileus. Phleboliths are noted in the pelvis. IMPRESSION: No evidence of bowel obstruction or ileus. Mild right basilar subsegmental atelectasis is noted. Distal tip of nasogastric tube seen in expected position of stomach. Aortic Atherosclerosis (ICD10-I70.0). Electronically Signed   By: Marijo Conception, M.D.   On: 04/27/2018 10:45   Dg Abd Acute W/chest  Result Date: 04/26/2018 CLINICAL DATA:  Gastric outlet obstruction EXAM: DG ABDOMEN ACUTE W/ 1V CHEST COMPARISON:  CT abdomen 04/24/2018 FINDINGS: There is a nasogastric tube with the tip projecting over the stomach. There is a hiatal hernia. There is relative decreased distension of the stomach compared with the prior CT of the abdomen. No radiopaque calculi or other significant radiographic abnormality is seen. 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 The lungs are hyperinflated likely secondary to COPD. There is a calcified right lower lobe pulmonary nodule consistent with prior granulomatous disease. There is no focal parenchymal opacity. There is no pleural effusion or pneumothorax. There is stable cardiomegaly. There is posterior spinal fusion from L3 through S1. IMPRESSION: There is a nasogastric tube with the tip projecting over the stomach. There is relative decreased distension of the stomach compared with the prior CT of the abdomen. There is interval improved aeration of bilateral lungs. Electronically Signed   By: Kathreen Devoid   On: 04/26/2018 11:47    EKG:   Orders placed or performed during the hospital encounter of 04/24/18  . ED EKG  .  ED EKG  . EKG 12-Lead  . EKG 12-Lead  . EKG    ASSESSMENT AND PLAN:   1. recurrent syncope likely vasovagal with orthostatic hypotension: ultrasound did not show an hemodynamically significant stenosis. Echocardiogram showed he is more than 55%.   2.  Hypokalemia, acute renal failure: Continue IV fluids and potassium supplementation. 3.  Acute  on chronic gastric volvulus: Patient has history of large hiatal hernia, chronic gastric volvulus, has been having vomiting, abdominal pain, has acute gastric volvulus,  Xray abdomen today is negative for obstruction, patient feels better, with  no abdominal pain seen by surgery, start clamping trial for NG tube. According to surgery  if no vomiting or discontinue unusual, patient started on clear liquids.    4.  Known history of right kidney cancer, but patient daughter mentioned that he has had a biopsy done at [facility] and known history of sarcoidosis though this spots in the lungs are present before.  I spoke with Dr. Randa Evens who suggested that patient already had a biopsy at around [facility] along with more test like CT chest, patient better served at more testing and continued care at [facility] rather than more testing here and daughter agrees for that.      patient daughter agrees for this plan.    #5 .diabetes mellitus type 2: Continue sliding scale insulin with coverage.  Hold Lantus.   #6 .hypokalemia: Replace the potassium.  Daughter is requesting transfer to [facility] hospital, discharge summary done, waiting to speak with MD at [facility] regarding the transfer. -   Discharge summaries already done . all the records are reviewed and case discussed with Care Management/Social Workerr. Management plans discussed with the patient, family and they are in agreement.  CODE STATUS: DNR  TOTAL TIME TAKING CARE OF THIS PATIENT: 38 minutes.  More than 50% time spent in counseling, coordination of care, discussing about the blood work that he had done at [facility] including right kidney biopsy, imaging, also discussing with Dr. Randa Evens from oncology and also I discussed extensively with patient's daughter. POSSIBLE D/C IN 1-2 DAYS, DEPENDING ON CLINICAL CONDITION.   Epifanio Lesches M.D on 04/27/2018 at 1:02 PM  Between 7am to 6pm - Pager - [phone]  After 6pm go to [email or web address] -  password [email or web address] [facility]  Office  [phone]  CC: Primary care physician; Desiree Hane, PA   Note: This dictation was prepared with Dragon dictation along with smaller phrase technology. Any transcriptional errors that result from this process are unintentional.

## 2018-04-27 NOTE — Care Management (Addendum)
MD paged for signature and update on [facility] request from late yesterday. Update 0816: signatures received from patient's daughter and MD for [facility] request to transfer. Message left for [place] with [facility] [phone] with this request and that forms have been faxed.

## 2018-04-27 NOTE — Progress Notes (Signed)
[place] responded to OR, [place] presented to patient's room, Identified myself as Luis Lopez, and was welcomed in by Mr. Dulworth. He reportely has a bowel obst, with a NGT in place and stated he's headed to the [facility] for surgery. 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 We shared about our mutual times serving in the [employer] and a friend of his joined [place] in the room. Prayer was offered by the [facility] and gleefully accepted by the patient and his friend, while his daughter seated beside [place] was speaking on the phone.Prayer ensued praying for Mr Clymer's continued strength and perseverance for he and his family. Saluatations of blessings, peace were conferred upon Mr Leinbach prior to my departure.

## 2018-04-27 NOTE — Progress Notes (Signed)
Patient ID: Keith Davila, male   DOB: 12/03/1928, 84 y.o.   MRN: [id number]  This NP visited patient at the bedside as a follow up to  yesterday's [place] for palliative medicine needs and emotional support.  Daughter at bedside.  Patient tells me he "feels better today".  Plan is still to transfer to the [place].  Case management RN working on paperwork.  Continue discussion with patient and his daughter regarding current medical situation. Questions and concerns addressed    Discussed with patient the importance of continued conversation with his family and his medical providers regarding overall plan of care and treatment options,  ensuring decisions are within the context of the patients values and GOCs.   Discussed with Dr Vianne Bulls   Total time spent on the unit was 15 minutes  Greater than 50% of the time was spent in counseling and coordination of care  Wadie Lessen NP  Palliative Medicine Team Team Phone # [phone] Pager [phone]

## 2018-04-27 NOTE — Progress Notes (Signed)
CC: gastric volvulus Subjective: No pain, KUB personally reviewed, significant improvement. NGT 1500cc Passing flatus and wishes to have NGT out  Objective: Vital signs in last 24 hours: Temp:  [98.3 F (36.8 C)-98.4 F (36.9 C)] 98.3 F (36.8 C) (07/09 0446) Pulse Rate:  [73-90] 85 (07/09 0446) Resp:  [18] 18 (07/09 0446) BP: (143-183)/(78-95) 159/88 (07/09 0446) SpO2:  [100 %] 100 % (07/09 0446) Last BM Date: 04/23/18  Intake/Output from previous day: 07/08 0701 - 07/09 0700 In: 1910.8 [I.V.:1910.8] Out: 2900 [Urine:1400; Emesis/NG output:1500] Intake/Output this shift: Total I/O In: -  Out: 1200 [Urine:400; Emesis/NG output:800]  Physical exam:  NAD, alert Abd: soft, NT, no peritonitis and non distended   Lab Results: CBC  Recent Labs    04/26/18 0843 04/27/18 0457  WBC 7.9 8.9  HGB 10.2* 10.7*  HCT 30.6* 32.5*  PLT 234 265   BMET Recent Labs    04/26/18 0843 04/27/18 0457  NA 145 146*  K 3.3* 3.3*  CL 106 106  CO2 29 26  GLUCOSE 147* 161*  BUN 22 21  CREATININE 1.20 1.11  CALCIUM 8.4* 8.5*   PT/INR No results for input(s): LABPROT, INR in the last 72 hours. ABG No results for input(s): PHART, HCO3 in the last 72 hours.  Invalid input(s): PCO2, PO2  Studies/Results: US Carotid Bilateral  Result Date: 04/26/2018 CLINICAL DATA:  Syncopal episode. History of hypertension, hyperlipidemia and diabetes EXAM: BILATERAL CAROTID DUPLEX ULTRASOUND TECHNIQUE: Pearline Cables scale imaging, color Doppler and duplex ultrasound were performed of bilateral carotid and vertebral arteries in the neck. COMPARISON:  Carotid Doppler ultrasound - 05/15/2015 FINDINGS: Criteria: Quantification of carotid stenosis is based on velocity parameters that correlate the residual internal carotid diameter with NASCET-based stenosis levels, using the diameter of the distal internal carotid lumen as the denominator for stenosis measurement. The following velocity measurements were obtained:  RIGHT ICA:  57/15 cm/sec CCA:  46/27 cm/sec SYSTOLIC ICA/CCA RATIO:  0.6 ECA:  60 cm/sec LEFT ICA:  76/20 cm/sec CCA:  035/00 cm/sec SYSTOLIC ICA/CCA RATIO:  0.7 ECA:  53 cm/sec RIGHT CAROTID ARTERY: There is a minimal amount of eccentric echogenic plaque within the right carotid bulb (image 17), extending to involve the origin and proximal aspects of the right internal carotid artery (image 25), not resulting in elevated peak systolic velocities within the interrogated course of the right internal carotid artery to suggest a hemodynamically significant stenosis. RIGHT VERTEBRAL ARTERY:  Antegrade flow LEFT CAROTID ARTERY: There is a minimal amount of scattered atherosclerotic plaque seen throughout the left common carotid artery (images 41, 42, 45 and 46). There is a moderate amount of eccentric mixed echogenic plaque within the left carotid bulb (image 51 and 52), extending to involve the origin and proximal aspects of the left internal carotid artery (image 62), not resulting in elevated peak systolic velocities within the interrogated course of the left internal carotid artery to suggest a hemodynamically significant stenosis. LEFT VERTEBRAL ARTERY:  Antegrade flow IMPRESSION: Minimal to moderate amount of bilateral atherosclerotic plaque, left greater than right, not resulting in a hemodynamically significant stenosis within either internal carotid artery. Electronically Signed   By: Sandi Mariscal M.D.   On: 04/26/2018 12:09   Dg Abd Acute W/chest  Result Date: 04/27/2018 CLINICAL DATA:  Gastric outlet obstruction. EXAM: DG ABDOMEN ACUTE W/ 1V CHEST COMPARISON:  Radiographs of April 26, 2018. FINDINGS: Stable cardiomegaly. Atherosclerosis of thoracic aorta is noted. No pneumothorax is noted. Left lung is clear. Mild right basilar atelectasis  is noted. Distal tip of nasogastric tube is seen in the stomach. No evidence of bowel obstruction or ileus. Phleboliths are noted in the pelvis. IMPRESSION: No evidence of  bowel obstruction or ileus. Mild right basilar subsegmental atelectasis is noted. Distal tip of nasogastric tube seen in expected position of stomach. Aortic Atherosclerosis (ICD10-I70.0). Electronically Signed   By: Marijo Conception, M.D.   On: 04/27/2018 10:45   Dg Abd Acute W/chest  Result Date: 04/26/2018 CLINICAL DATA:  Gastric outlet obstruction EXAM: DG ABDOMEN ACUTE W/ 1V CHEST COMPARISON:  CT abdomen 04/24/2018 FINDINGS: There is a nasogastric tube with the tip projecting over the stomach. There is a hiatal hernia. There is relative decreased distension of the stomach compared with the prior CT of the abdomen. No radiopaque calculi or other significant radiographic abnormality is seen. 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 The lungs are hyperinflated likely secondary to COPD. There is a calcified right lower lobe pulmonary nodule consistent with prior granulomatous disease. There is no focal parenchymal opacity. There is no pleural effusion or pneumothorax. There is stable cardiomegaly. There is posterior spinal fusion from L3 through S1. IMPRESSION: There is a nasogastric tube with the tip projecting over the stomach. There is relative decreased distension of the stomach compared with the prior CT of the abdomen. There is interval improved aeration of bilateral lungs. Electronically Signed   By: Kathreen Devoid   On: 04/26/2018 11:47    Anti-infectives: Anti-infectives (From admission, onward)   None      Assessment/Plan:  May start clamp trial today, if no N/V may DC NGT No surgical intervention Continue to follow  Caroleen Hamman, MD, FACS  04/27/2018

## 2018-04-27 NOTE — Discharge Summary (Signed)
Keith Davila, is a 84 y.o. male  DOB [birth year]  MRN [id number].  Admission date:  04/24/2018  Admitting Physician  Amelia Jo, MD  Discharge Date:  04/27/2018   Primary MD  Desiree Hane, PA  Recommendations for primary care physician for things to follow:    Transfer to [facility] when the bed is available.  Admission Diagnosis  Dehydration [E86.0] Gastric volvulus [K31.89] Syncope, unspecified syncope type [R55]   Discharge Diagnosis  Dehydration [E86.0] Gastric volvulus [K31.89] Syncope, unspecified syncope type [R55]    Active Problems:   Gastric volvulus   Palliative care by specialist   DNR (do not resuscitate)   Gastric outlet obstruction   Renal cell carcinoma ([name])      Past Medical History:  Diagnosis Date  . Anemia   . Arthritis   . Cancer [name]    prostate  . COPD (chronic obstructive pulmonary disease) ([name])   . Diabetes mellitus without complication ([name])   . GERD (gastroesophageal reflux disease)   . Hyperlipidemia   . Hypertension   . MRSA (methicillin resistant staph aureus) culture positive 2009  . Peripheral vascular disease ([name])   . Pneumonia   . prostate   . Renal disorder   . Varicose veins     Past Surgical History:  Procedure Laterality Date  . BACK SURGERY    . ESOPHAGOGASTRODUODENOSCOPY (EGD) WITH PROPOFOL N/A 12/17/2016   Procedure: ESOPHAGOGASTRODUODENOSCOPY (EGD) WITH PROPOFOL;  Surgeon: Manya Silvas, MD;  Location: [name];  Service: Endoscopy;  Laterality: N/A;  . HERNIA REPAIR    . JOINT REPLACEMENT    . SPINE SURGERY    . TILT TABLE STUDY N/A 02/11/2017   Procedure: Tilt Table Study;  Surgeon: Sanda Klein, MD;  Location: [name];  Service: Cardiovascular;  Laterality: N/A;       History of present illness and  Hospital Course:      Kindly see H&P for history of present illness and admission details, please review complete Labs, Consult reports and Test reports for all details in brief  HPI  from the history and physical done on the day of admission 84 year old male patient with history of GERD, hiatal hernia, chronic gastric wellness admitted because of syncope, acute on chronic gastroenteritis with abdominal pain, vomiting. Patient had attributed to syncopal episodes which prompted admission to hospital but in the ER patient had [place] sandwich after that he vomited and had severe abdominal pain but CT abdomen showed gastric wellness.   Hospital Course  : syncopal event likely secondary to astrological with hypotension, patient had orthostatic hypotension. Patient received IV fluids, patient echocardiogram showed EF 34 m 3%, carotid ultrasound did not show any hemodynamically significant stenosis.  2 acute on chronic gastric volvulus; patient has chronic large progressively symptomatic hiatal hernia, chronic gastric illness. Patient had NG tube decompression and seen by general surgery, patient has been started on an NG tube intermittent suction. Patient followed by surgery every day, no abdominal pain no. Patient family is requesting transferred to [name] for continued care as is from [place]. Patient began history at this time is gastric volvulus affecting his quality of life.   3. Patient has right renal cell carcinoma that's been going on since February but there is a small increase in size patient states he abdomen and patient now has 6 cm) cell carcinoma, patient seemed oncology Dr. Janese Banks, she recommended that she patient can follow the primary oncologist to [name], patient daughter mentioned that  patient had right kidney biopsy and has been following with [name] Patient had CT abdomen he had a, family requesting CD of CT abdomen for compassion, patient will be getting care through you urology, oncology at the  [name] 4.pulmonary sarcoidosis: patient CT abdomen showed possible metastasis in the lungs but patient daughter told me that this [name] were present before and patient does have a history of diagnosis. Of pulmonary sarcoidosis. 5,diabetes mellitus type II: continue sliding scale insulin with courage. Hold Lantus.  Hypokalemia secondary to poor PO intake: replace potassium. Discharge Condition: stable   Follow UP      Discharge Instructions  and  Discharge Medications      Allergies as of 04/27/2018      Reactions   Prevacid [lansoprazole]       Medication List    STOP taking these medications   insulin glargine 100 unit/mL Sopn Commonly known as:  LANTUS   NOVOLOG 100 UNIT/ML injection Generic drug:  insulin aspart     TAKE these medications   acetaminophen 325 MG tablet Commonly known as:  TYLENOL Take 650 mg by mouth every 4 (four) hours as needed. for pain/ increased temp. May be administered orally, per G-tube if needed or rectally if unable to swallow (separate order). Maximum dose for 24 hours is 3,000 mg from all sources of Acetaminophen/ Tylenol   albuterol 108 (90 Base) MCG/ACT inhaler Commonly known as:  PROVENTIL HFA;VENTOLIN HFA Inhale 2 puffs into the lungs 3 (three) times daily as needed for wheezing or shortness of breath.   aspirin 81 MG tablet Take 81 mg by mouth daily.   Cinnamon 500 MG capsule Take 1,000 mg by mouth 2 (two) times daily.   ferrous sulfate 325 (65 FE) MG tablet Take 325 mg by mouth 2 (two) times daily.   latanoprost 0.005 % ophthalmic solution Commonly known as:  XALATAN Place 1 drop into both eyes at bedtime.   losartan 25 MG tablet Commonly known as:  COZAAR Take 1 tablet (25 mg total) by mouth daily.   senna 8.6 MG Tabs tablet Commonly known as:  SENOKOT Take 1 tablet by mouth every morning.   SIMBRINZA 1-0.2 % Susp Generic drug:  Brinzolamide-Brimonidine Place 1 drop into the right eye 3 (three) times daily.    simvastatin 40 MG tablet Commonly known as:  ZOCOR Take 40 mg by mouth every evening.   tamsulosin 0.4 MG Caps capsule Commonly known as:  FLOMAX Take 0.4 mg by mouth every evening.   timolol 0.5 % ophthalmic solution Commonly known as:  BETIMOL Place 1 drop into the right eye daily.   vitamin B-12 1000 MCG tablet Commonly known as:  CYANOCOBALAMIN Take 1,000 mcg by mouth daily.   vitamin C 500 MG tablet Commonly known as:  ASCORBIC ACID Take 500 mg by mouth 2 (two) times daily.      medications as per present MAR.   Diet and Activity recommendation: See Discharge Instructions above   Consults obtained - general surgery, palliative care, hematology oncology   Major procedures and Radiology Reports - PLEASE review detailed and final reports for all details, in brief -      Ct Abdomen Pelvis W Contrast  Result Date: 04/24/2018 CLINICAL DATA:  Vomiting. EXAM: CT ABDOMEN AND PELVIS WITH CONTRAST TECHNIQUE: Multidetector CT imaging of the abdomen and pelvis was performed using the standard protocol following bolus administration of intravenous contrast. CONTRAST:  156mL OMNIPAQUE IOHEXOL 300 MG/ML  SOLN COMPARISON:  None. FINDINGS: Lower  chest: Calcified granulomas are seen lung bases. There are several uncalcified nodules as well. Atelectasis adjacent to a large hiatal hernia. No pneumonia or aspiration. There is a large hiatal hernia which is fluid-filled and dilated. The hernia displaces the heart anteriorly. Coronary artery calcifications are noted. There appears to be adenopathy in the right hilum, incompletely evaluated. Hepatobiliary: There is a wedge-shaped region of decreased attenuation in the liver such as on series 2, image 21 which dissipates on delayed imaging consistent with a perfusion anomaly of no acute significance. There is a mass in the right hepatic lobe on series 2, image 15 measuring 3.2 cm with an attenuation of 28 Hounsfield units. This mass persists on  delayed imaging. No other liver masses identified. The portal vein is not well assessed on initial imaging but is opacified on delayed imaging. No portal vein thrombosis noted. The gallbladder is normal. Pancreas: Pancreatic duct is mildly prominent but pancreas is otherwise normal. No evidence of pancreatitis or pancreatic mass. Spleen: Normal in size without focal abnormality. Adrenals/Urinary Tract: There is an indeterminate left adrenal nodule measuring 14 mm. There is a small right adrenal nodule which is indeterminate as well measuring 11 mm. There is a mass in the posterior right kidney on series 2, image 22 measuring up to 6 cm consistent with malignancy. There may be some subtle nodularity adjacent to the kidney as well on series 2, image 21. There is a more subtle region of decreased attenuation anteriorly in the right kidney on series 2, image 21. Another small malignancy is not excluded although this region is indeterminate. There are bilateral renal cysts. No hydronephrosis. No ureteral stones. The bladder is normal. Stomach/Bowel: The hiatal hernia which is quite large is distended and fluid-filled. The stomach become smaller in caliber as it passes into the abdomen as seen on series 2, image 19. The stomach below the diaphragm is twisted with the gastric outlet on the left as seen on series 2, image 18 and coronal image 33. The duodenum then makes 180 degree turn back to the right. The duodenum is decompressed. No small bowel obstruction. There is a stool ball in the rectum. The sigmoid colon extends into the upper abdomen between the peritoneum and the dilated stomach. No convincing evidence of sigmoid volvulus the remainder of the colon is unremarkable. Visualized appendix is unremarkable. Vascular/Lymphatic: The abdominal aorta is tortuous. The infrarenal abdominal aorta measures 2.6 cm. Atherosclerotic changes extend from the abdominal aorta into the iliac and femoral vessels. No adenopathy in the  abdomen. Reproductive: Brachytherapy seeds seen in the prostate. Other: There is a fat containing ventral hernia. No free air or free fluid. Musculoskeletal: Degenerative changes seen in the spine. IMPRESSION: 1. There is a gastric volvulus. There is clearly a volvulus centered at the gastric outlet which is located in the left upper quadrant. There is also a distended fluid-filled hiatal hernia. The stomach becomes smaller in caliber as the hernia extends through the hiatus but there is no definitive volvulus at the level of the hiatus. 2. 6 cm right renal carcinoma. There may be some mild nodularity adjacent to the left kidney in this region as well. A more subtle region anteriorly in the left kidney could represent complicated cystic change versus another tiny malignancy. MRI could better assess. 3. Apparent adenopathy in the right hilum. Given the right renal malignancy, the findings are concerning for metastatic disease but incompletely evaluated. 4. Multiple pulmonary nodules. Many are calcified consistent with previous granulomatous disease. Some are  not calcified. I suspect the uncalcified nodules may simply represent uncalcified granulomas but metastatic disease would be difficult to completely exclude. 5. There is an indeterminate mass in the liver described above. This is not fill on in on delayed images and is not a typical hemangioma. It could represent a complicated cyst. However, metastatic lesion is not completely excluded. 6. Redundant sigmoid colon. The sigmoid colon is mildly distended between the dilated stomach an the peritoneum but there is no definitive volvulus in this region. 7. Indeterminate adrenal nodules. While adenomas are possible, given the malignancy in the kidney, metastatic disease should be considered. 8. Mildly tortuous atherosclerotic abdominal aorta. Ectatic abdominal aorta at risk for aneurysm development. Recommend followup by ultrasound in 5 years. This recommendation  follows ACR consensus guidelines: White Paper of the ACR Incidental Findings Committee II on Vascular Findings. J Am Coll Radiol 2013; 10:789-794. The patient may benefit from a PET-CT to further evaluate the apparent adenopathy in the right hilum, the mass in the liver, in the adrenal nodularity. This could be done non emergently. Findings called to Dr. Archie Balboa. Electronically Signed   By: Dorise Bullion III M.D   On: 04/24/2018 21:23   US Carotid Bilateral  Result Date: 04/26/2018 CLINICAL DATA:  Syncopal episode. History of hypertension, hyperlipidemia and diabetes EXAM: BILATERAL CAROTID DUPLEX ULTRASOUND TECHNIQUE: Pearline Cables scale imaging, color Doppler and duplex ultrasound were performed of bilateral carotid and vertebral arteries in the neck. COMPARISON:  Carotid Doppler ultrasound - 05/15/2015 FINDINGS: Criteria: Quantification of carotid stenosis is based on velocity parameters that correlate the residual internal carotid diameter with NASCET-based stenosis levels, using the diameter of the distal internal carotid lumen as the denominator for stenosis measurement. The following velocity measurements were obtained: RIGHT ICA:  57/15 cm/sec CCA:  11/94 cm/sec SYSTOLIC ICA/CCA RATIO:  0.6 ECA:  60 cm/sec LEFT ICA:  76/20 cm/sec CCA:  174/08 cm/sec SYSTOLIC ICA/CCA RATIO:  0.7 ECA:  53 cm/sec RIGHT CAROTID ARTERY: There is a minimal amount of eccentric echogenic plaque within the right carotid bulb (image 17), extending to involve the origin and proximal aspects of the right internal carotid artery (image 25), not resulting in elevated peak systolic velocities within the interrogated course of the right internal carotid artery to suggest a hemodynamically significant stenosis. RIGHT VERTEBRAL ARTERY:  Antegrade flow LEFT CAROTID ARTERY: There is a minimal amount of scattered atherosclerotic plaque seen throughout the left common carotid artery (images 41, 42, 45 and 46). There is a moderate amount of eccentric  mixed echogenic plaque within the left carotid bulb (image 51 and 52), extending to involve the origin and proximal aspects of the left internal carotid artery (image 62), not resulting in elevated peak systolic velocities within the interrogated course of the left internal carotid artery to suggest a hemodynamically significant stenosis. LEFT VERTEBRAL ARTERY:  Antegrade flow IMPRESSION: Minimal to moderate amount of bilateral atherosclerotic plaque, left greater than right, not resulting in a hemodynamically significant stenosis within either internal carotid artery. Electronically Signed   By: Sandi Mariscal M.D.   On: 04/26/2018 12:09   Dg Chest Portable 1 View  Result Date: 04/24/2018 CLINICAL DATA:  Initial evaluation status post NG tube placement. EXAM: PORTABLE CHEST 1 VIEW COMPARISON:  Prior radiograph from 05/06/2015. FINDINGS: NG tube seen coursing inferiorly into the upper stomach with nonvisualization of the tip. Cardiomegaly, stable from previous. Mediastinal silhouette within normal limits. Aortic atherosclerosis. Lungs are hypoinflated. Small bilateral pleural effusions. Perihilar vascular congestion without frank pulmonary edema. No  definite focal airspace disease. No pneumothorax. No acute osseus abnormality. IMPRESSION: 1. Enteric tube in place, seen coursing into the upper abdomen with nonvisualization of the tip. 2. Cardiomegaly with perihilar vascular congestion and probable small bilateral pleural effusions. 3. Aortic atherosclerosis. Electronically Signed   By: Jeannine Boga M.D.   On: 04/24/2018 22:57   Dg Abd Acute W/chest  Result Date: 04/27/2018 CLINICAL DATA:  Gastric outlet obstruction. EXAM: DG ABDOMEN ACUTE W/ 1V CHEST COMPARISON:  Radiographs of April 26, 2018. FINDINGS: Stable cardiomegaly. Atherosclerosis of thoracic aorta is noted. No pneumothorax is noted. Left lung is clear. Mild right basilar atelectasis is noted. Distal tip of nasogastric tube is seen in the stomach.  No evidence of bowel obstruction or ileus. Phleboliths are noted in the pelvis. IMPRESSION: No evidence of bowel obstruction or ileus. Mild right basilar subsegmental atelectasis is noted. Distal tip of nasogastric tube seen in expected position of stomach. Aortic Atherosclerosis (ICD10-I70.0). Electronically Signed   By: Marijo Conception, M.D.   On: 04/27/2018 10:45   Dg Abd Acute W/chest  Result Date: 04/26/2018 CLINICAL DATA:  Gastric outlet obstruction EXAM: DG ABDOMEN ACUTE W/ 1V CHEST COMPARISON:  CT abdomen 04/24/2018 FINDINGS: There is a nasogastric tube with the tip projecting over the stomach. There is a hiatal hernia. There is relative decreased distension of the stomach compared with the prior CT of the abdomen. No radiopaque calculi or other significant radiographic abnormality is seen. 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 The lungs are hyperinflated likely secondary to COPD. There is a calcified right lower lobe pulmonary nodule consistent with prior granulomatous disease. There is no focal parenchymal opacity. There is no pleural effusion or pneumothorax. There is stable cardiomegaly. There is posterior spinal fusion from L3 through S1. IMPRESSION: There is a nasogastric tube with the tip projecting over the stomach. There is relative decreased distension of the stomach compared with the prior CT of the abdomen. There is interval improved aeration of bilateral lungs. Electronically Signed   By: Kathreen Devoid   On: 04/26/2018 11:47    Micro Results     Recent Results (from the past 240 hour(s))  MRSA PCR Screening     Status: None   Collection Time: 04/25/18  2:39 AM  Result Value Ref Range Status   MRSA by PCR NEGATIVE NEGATIVE Final    Comment:        The GeneXpert MRSA Assay (FDA approved for NASAL specimens only), is one component of a comprehensive MRSA colonization surveillance program. It is not intended to diagnose MRSA infection nor to guide or monitor  treatment for MRSA infections. Performed at [facility], [address] [place], [place] [address]        Today   Subjective:    will be  transferred to [facility] when the bed is available.  Objective:   Blood pressure (!) 159/88, pulse 85, temperature 98.3 F (36.8 C), temperature source Oral, resp. rate 18, height 6\' 2"  (1.88 m), weight 77.9 kg (171 lb 11.8 oz), SpO2 100 %.   Intake/Output Summary (Last 24 hours) at 04/27/2018 1301 Last data filed at 04/27/2018 1044 Gross per 24 hour  Intake 835.84 ml  Output 3100 ml  Net -2264.16 ml    Exam Awake Alert, Oriented x 3, No new F.N deficits, Normal affect patient has NG tube intact. .AT,PERRAL Supple Neck,No JVD, No cervical lymphadenopathy appriciated.  Symmetrical Chest wall movement, Good air movement bilaterally, CTAB RRR,No Gallops,Rubs or  new Murmurs, No Parasternal Heave +ve B.Sounds, Abd Soft, Non tender, No organomegaly appriciated, No rebound -guarding or rigidity. No Cyanosis, Clubbing or edema, No new Rash or bruise  Data Review   CBC w Diff:  Lab Results  Component Value Date   WBC 8.9 04/27/2018   HGB 10.7 (L) 04/27/2018   HCT 32.5 (L) 04/27/2018   PLT 265 04/27/2018    CMP:  Lab Results  Component Value Date   NA 146 (H) 04/27/2018   K 3.3 (L) 04/27/2018   CL 106 04/27/2018   CO2 26 04/27/2018   BUN 21 04/27/2018   CREATININE 1.11 04/27/2018   PROT 6.7 04/24/2018   ALBUMIN 3.6 04/24/2018   BILITOT 0.7 04/24/2018   ALKPHOS 47 04/24/2018   AST 23 04/24/2018   ALT 12 04/24/2018  .   Total Time in preparing paper work, data evaluation and todays exam - 55 minutes  Epifanio Lesches M.D on 04/27/2018 at 1:01 PM    Note: This dictation was prepared with Dragon dictation along with smaller phrase technology. Any transcriptional errors that result from this process are unintentional.

## 2018-04-27 NOTE — Evaluation (Signed)
Physical Therapy Evaluation Patient Details Name: Keith Davila MRN: [id number] DOB: 09-Sep-1929 Today's Date: 04/27/2018   History of Present Illness   84 y.o. male, [facility], with a known history of anemia of chronic disease, arthritis, prostate cancer, COPD, diabetes type 2 and other comorbidities.  In ED pt started vomiting, CAT scan of the abdomen was ordered and the results revealed gastric volvulus and right renal carcinoma with possible metastasis to adrenal glands and liver.  Clinical Impression  Pt eager to get up as he has been in bed a few days, he ultimately did well with PT showing ability to do bed mobility and transfers w/o assist, though he was slow and labored with the effort.  He was able to slowly, but safely participate with ~12 minutes of gait training walking ~110 ft with slow but consistent cadence, constant reminders for walker positioning and posture.  Per today's performance he will need HHPT on discharge, however he will be transferring to the [place] and will need further PT f/u there.    Follow Up Recommendations Home health PT    Equipment Recommendations  None recommended by PT    Recommendations for Other Services       Precautions / Restrictions Precautions Precautions: Fall Restrictions Weight Bearing Restrictions: No      Mobility  Bed Mobility Overal bed mobility: Modified Independent             General bed mobility comments: Pt with only light use of rails to get to EOB, able to maintain sitting balance w/o assist  Transfers Overall transfer level: Modified independent Equipment used: Rolling walker (2 wheeled)             General transfer comment: Raised bed height 3-4 inches, pt able to rise with heavy UE use but no direct physical assist  Ambulation/Gait Ambulation/Gait assistance: Supervision Gait Distance (Feet): 110 Feet Assistive device: Rolling walker (2 wheeled)       General Gait Details: Pt with slow cadence and  stooped posture, but daughter and granddaughter report that he is not too far from his baseline in these regards.  He was able to increase speed/cadence and posture with cuing but tended to quickly revert (again family reports this is somewhat normal).  Pt with decreased quality of motion with foot placement and posturing, but has no LOBs or overt safety issues. His O2 remained in the high 90s with HR to ~120 with the effort.   Stairs            Wheelchair Mobility    Modified Rankin (Stroke Patients Only)       Balance Overall balance assessment: Modified Independent                                           Pertinent Vitals/Pain Pain Assessment: (chronic back pain, no new/acute pain)    Home Living Family/patient expects to be discharged to:: Private residence Living Arrangements: Other relatives;Children Available Help at Discharge: Family;Personal care attendant   Home Access: Ramped entrance       Home Equipment: Walker - 2 wheels;Walker - 4 wheels      Prior Function Level of Independence: Independent with assistive device(s);Needs assistance   Gait / Transfers Assistance Needed: Pt is not generally able to do prolonged ambulation, does some out of home walking regularly (down ramp to sit in carport, etc)  ADL's /  Homemaking Assistance Needed: Pt does have AM and PM aides to assist with ADLs, etc        Hand Dominance        Extremity/Trunk Assessment   Upper Extremity Assessment Upper Extremity Assessment: Generalized weakness(R UE grossly 4/5, L grossly 3+/5)    Lower Extremity Assessment Lower Extremity Assessment: Generalized weakness(R LE grossly 4/5, L grossly 3+/5)       Communication   Communication: No difficulties  Cognition Arousal/Alertness: Awake/alert Behavior During Therapy: WFL for tasks assessed/performed Overall Cognitive Status: Within Functional Limits for tasks assessed                                         General Comments      Exercises     Assessment/Plan    PT Assessment Patient needs continued PT services  PT Problem List Decreased strength;Decreased range of motion;Decreased activity tolerance;Decreased balance;Decreased mobility;Decreased knowledge of use of DME;Decreased coordination;Decreased safety awareness;Decreased knowledge of precautions       PT Treatment Interventions DME instruction;Gait training;Stair training;Functional mobility training;Therapeutic activities;Therapeutic exercise;Balance training;Cognitive remediation;Patient/family education    PT Goals (Current goals can be found in the Care Plan section)  Acute Rehab PT Goals Patient Stated Goal: go to the [place] PT Goal Formulation: With patient Time For Goal Achievement: 05/11/18 Potential to Achieve Goals: Good    Frequency Min 2X/week   Barriers to discharge        Co-evaluation               AM-PAC PT "6 Clicks" Daily Activity  Outcome Measure Difficulty turning over in bed (including adjusting bedclothes, sheets and blankets)?: None Difficulty moving from lying on back to sitting on the side of the bed? : A Little Difficulty sitting down on and standing up from a chair with arms (e.g., wheelchair, bedside commode, etc,.)?: A Little Help needed moving to and from a bed to chair (including a wheelchair)?: None Help needed walking in hospital room?: None Help needed climbing 3-5 steps with a railing? : A Little 6 Click Score: 21    End of Session Equipment Utilized During Treatment: Gait belt Activity Tolerance: Patient limited by fatigue;Patient tolerated treatment well Patient left: in chair;with call bell/phone within reach;with nursing/sitter in room   PT Visit Diagnosis: Muscle weakness (generalized) (M62.81);Difficulty in walking, not elsewhere classified (R26.2)    Time: 8563-1497 PT Time Calculation (min) (ACUTE ONLY): 31 min   Charges:   PT Evaluation $PT  Eval Low Complexity: 1 Low PT Treatments $Gait Training: 8-22 mins   PT G Codes:        Kreg Shropshire, DPT 04/27/2018, 4:16 PM

## 2018-04-27 NOTE — Progress Notes (Signed)
Spoke with physician [facility] ,at phone number [phone], accepted the patient, patient will be transferred to [facility] when bed is available.

## 2018-04-28 DIAGNOSIS — R531 Weakness: Secondary | ICD-10-CM

## 2018-04-28 LAB — GLUCOSE, CAPILLARY
GLUCOSE-CAPILLARY: 345 mg/dL — AB (ref 70–99)
GLUCOSE-CAPILLARY: 245 mg/dL — AB (ref 70–99)
Glucose-Capillary: 178 mg/dL — ABNORMAL HIGH (ref 70–99)
Glucose-Capillary: 275 mg/dL — ABNORMAL HIGH (ref 70–99)

## 2018-04-28 LAB — BASIC METABOLIC PANEL
Anion gap: 8 (ref 5–15)
BUN: 27 mg/dL — AB (ref 8–23)
CHLORIDE: 108 mmol/L (ref 98–111)
CO2: 28 mmol/L (ref 22–32)
Calcium: 7.7 mg/dL — ABNORMAL LOW (ref 8.9–10.3)
Creatinine, Ser: 1.42 mg/dL — ABNORMAL HIGH (ref 0.61–1.24)
GFR calc Af Amer: 50 mL/min — ABNORMAL LOW (ref >60)
GFR calc non Af Amer: 43 mL/min — ABNORMAL LOW (ref >60)
Glucose, Bld: 205 mg/dL — ABNORMAL HIGH (ref 70–99)
POTASSIUM: 3.7 mmol/L (ref 3.5–5.1)
SODIUM: 144 mmol/L (ref 135–145)

## 2018-04-28 LAB — MAGNESIUM: MAGNESIUM: 1.9 mg/dL (ref 1.7–2.4)

## 2018-04-28 MED ORDER — METOCLOPRAMIDE HCL 5 MG/ML IJ SOLN: 5.0000 mg | Freq: Four times a day (QID) | INTRAMUSCULAR | Status: DC | PRN

## 2018-04-28 MED ORDER — FAMOTIDINE IN NACL 20-0.9 MG/50ML-% IV SOLN
20.0000 mg | Freq: Two times a day (BID) | INTRAVENOUS | Status: DC
  Administered 2018-04-28 (×2): 20 mg via INTRAVENOUS
  Filled 2018-04-28: qty 50

## 2018-04-28 NOTE — Progress Notes (Signed)
Physical Therapy Treatment Patient Details Name: Ana Liaw MRN: [id number] DOB: [birth year] Today's Date: 04/28/2018    History of Present Illness  84 y.o. male, [facility], with a known history of anemia of chronic disease, arthritis, prostate cancer, COPD, diabetes type 2 and other comorbidities.  In ED pt started vomiting, CAT scan of the abdomen was ordered and the results revealed gastric volvulus and right renal carcinoma with possible metastasis to adrenal glands and liver.    PT Comments    Pt agreeable to PT; denies pain. Notes overall feeling better; states he will need to have surgery in the near future and needs to stay strong to "make it through surgery". Pt requires assist for STS transfers despite rocking technique from lower surface (recliner). With several attempts and Min A pt able to rise; less assist from elevated bed surface. Pt does require safety cues for hand placement with sit each time. Pt's rolling walker raised for improved height/fit; pt does walk with forward flexed posture (baseline). Slow cadence with decreased foot clearance, but no LOB and overall safe technique. Min guard for safety. Pt progressing well and motivated. Continue PT to progress strength, endurance and overall safety of transfers to improve all functional mobility.    Follow Up Recommendations  Home health PT;Other (comment)(going to [facility])     Equipment Recommendations       Recommendations for Other Services       Precautions / Restrictions Precautions Precautions: Fall Restrictions Weight Bearing Restrictions: No    Mobility  Bed Mobility               General bed mobility comments: Not tested; up in chair  Transfers Overall transfer level: Needs assistance Equipment used: Rolling walker (2 wheeled) Transfers: Sit to/from Stand Sit to Stand: Min assist(from lower surface. Min guard from elevated surface)         General transfer comment: Requires cues for safe  hand placement 100%. Performed from recliner and from elevated bed. 2x each  Ambulation/Gait Ambulation/Gait assistance: Min guard Gait Distance (Feet): 120 Feet Assistive device: Rolling walker (2 wheeled) Gait Pattern/deviations: Step-through pattern;Decreased stride length;Trunk flexed;Decreased dorsiflexion - right;Decreased dorsiflexion - left(decreased foot clearance B)   Gait velocity interpretation: <1.31 ft/sec, indicative of household ambulator General Gait Details: Rw adjusted form improved fit and increased upright posture (continues with trunk flexed, baseline) slow with 2 stand rest periods. Decreased clearance of B feet occasionally sliding on floor, but no LOB. Safe turns.    Stairs Stairs: (denies need for steps at [facility])           Wheelchair Mobility    Modified Rankin (Stroke Patients Only)       Balance Overall balance assessment: Needs assistance Sitting-balance support: Feet supported Sitting balance-Leahy Scale: Good     Standing balance support: Bilateral upper extremity supported Standing balance-Leahy Scale: Fair                              Cognition Arousal/Alertness: Awake/alert Behavior During Therapy: WFL for tasks assessed/performed Overall Cognitive Status: Within Functional Limits for tasks assessed                                        Exercises General Exercises - Lower Extremity Quad Sets: Strengthening;Both;15 reps;Standing Gluteal Sets: Strengthening;Both;15 reps;Standing Long Arc Quad: AROM;Strengthening;Both;15 reps;Seated Other Exercises  Other Exercises: chair push up 15x    General Comments        Pertinent Vitals/Pain Pain Assessment: No/denies pain    Home Living                      Prior Function            PT Goals (current goals can now be found in the care plan section) Progress towards PT goals: Progressing toward goals    Frequency    Min 2X/week      PT  Plan Current plan remains appropriate    Co-evaluation              AM-PAC PT "6 Clicks" Daily Activity  Outcome Measure  Difficulty turning over in bed (including adjusting bedclothes, sheets and blankets)?: None Difficulty moving from lying on back to sitting on the side of the bed? : A Little Difficulty sitting down on and standing up from a chair with arms (e.g., wheelchair, bedside commode, etc,.)?: Unable Help needed moving to and from a bed to chair (including a wheelchair)?: A Little Help needed walking in hospital room?: A Little Help needed climbing 3-5 steps with a railing? : A Little 6 Click Score: 17    End of Session Equipment Utilized During Treatment: Gait belt Activity Tolerance: Patient tolerated treatment well Patient left: in chair;with family/visitor present;with call bell/phone within reach(declines chair alarm)   PT Visit Diagnosis: Muscle weakness (generalized) (M62.81);Difficulty in walking, not elsewhere classified (R26.2)     Time: 1018-1100 PT Time Calculation (min) (ACUTE ONLY): 42 min  Charges:  $Gait Training: 8-22 mins $Therapeutic Exercise: 8-22 mins $Therapeutic Activity: 8-22 mins                    G Codes:        Larae Grooms, PTA 04/28/2018, 11:24 AM

## 2018-04-28 NOTE — Progress Notes (Signed)
SURGICAL PROGRESS NOTE (cpt [phone])  Hospital Day(s): 3.   Post op day(s): 4 Days Post-Op.   Interval History: Patient seen and examined, no acute events or new complaints overnight. Patient reports no further abdominal pain, nausea, emesis, or abdominal distention while tolerating clear liquids without difficulty and with +flatus and +BM. Patient denies fever/chills, CP, or SOB, and he and his daughter inquire about a bowel regimen for his chronic supplemental iron- and diet- associated constipation.  Review of Systems:  Constitutional: denies fever, chills  HEENT: denies cough or congestion  Respiratory: denies any shortness of breath  Cardiovascular: denies chest pain or palpitations  Gastrointestinal: abdominal pain, N/V, and bowel function as per interval history Genitourinary: denies burning with urination or urinary frequency Musculoskeletal: denies pain, decreased motor or sensation Integumentary: denies any other rashes or skin discolorations Neurological: denies HA or vision/hearing changes   Vital signs in last 24 hours: [min-max] current  Temp:  [98.1 F (36.7 C)-98.5 F (36.9 C)] 98.1 F (36.7 C) (07/10 0856) Pulse Rate:  [80-93] 93 (07/10 1018) Resp:  [17-18] 17 (07/10 0425) BP: (140-164)/(77-98) 148/83 (07/10 0856) SpO2:  [98 %-100 %] 98 % (07/10 1018) Weight:  [168 lb 6.9 oz (76.4 kg)] 168 lb 6.9 oz (76.4 kg) (07/10 0425)     Height: 6\' 2"  (188 cm) Weight: 168 lb 6.9 oz (76.4 kg) BMI (Calculated): 21.62   Intake/Output this shift:  Total I/O In: 600 [P.O.:600] Out: -    Intake/Output last 2 shifts:  @IOLAST2SHIFTS @   Physical Exam:  Constitutional: alert, cooperative and no distress  HENT: normocephalic without obvious abnormality  Eyes: PERRL, EOM's grossly intact and symmetric  Neuro: CN II - XII grossly intact and symmetric without deficit  Respiratory: breathing non-labored at rest  Cardiovascular: regular rate and sinus rhythm  Gastrointestinal:  soft, non-tender, and non-distended Musculoskeletal: UE and LE FROM, no edema or wounds, motor and sensation grossly intact, NT   Labs:  CBC Latest Ref Rng & Units 04/27/2018 04/26/2018 04/25/2018  WBC 3.8 - 10.6 K/uL 8.9 7.9 7.5  Hemoglobin 13.0 - 18.0 g/dL 10.7(L) 10.2(L) 9.6(L)  Hematocrit 40.0 - 52.0 % 32.5(L) 30.6(L) 29.1(L)  Platelets 150 - 440 K/uL 265 234 245   CMP Latest Ref Rng & Units 04/28/2018 04/27/2018 04/26/2018  Glucose 70 - 99 mg/dL 205(H) 161(H) 147(H)  BUN 8 - 23 mg/dL 27(H) 21 22  Creatinine 0.61 - 1.24 mg/dL 1.42(H) 1.11 1.20  Sodium 135 - 145 mmol/L 144 146(H) 145  Potassium 3.5 - 5.1 mmol/L 3.7 3.3(L) 3.3(L)  Chloride 98 - 111 mmol/L 108 106 106  CO2 22 - 32 mmol/L 28 26 29   Calcium 8.9 - 10.3 mg/dL 7.7(L) 8.5(L) 8.4(L)  Total Protein 6.5 - 8.1 g/dL - - -  Total Bilirubin 0.3 - 1.2 mg/dL - - -  Alkaline Phos 38 - 126 U/L - - -  AST 15 - 41 U/L - - -  ALT 0 - 44 U/L - - -   Imaging studies: No new pertinent imaging studies   Assessment/Plan: (ICD-10's:K56.2,K44.0) 84 y.o.malewith chronic large and progressively symptomatic hiatal hernia with likewise chronic gastric volvulus, complicated byalso known and untreated enlarged and possibly metastatic now 6 cm Right renal cell carcinoma and bypertinent comorbidities includingDM, HTN, HLD, peripheral arterial disease, aortic ectasia, CKD, COPD not on home supplemental oxygen, chronic anemia, GERD, prostate cancer, and osteoarthritis.  - NG tube removed yesterday, since which patient has been tolerating clear liquids - patient and his daughter continue to  state their interest in management only for patient's comfort > longevity  - diet advanced to full liquids and may consider advancing to soft/chopped foods, small well-chewed portions advised  - patient and his daughter express understanding that dysphasia and emesis will likely eventually recur if not surgically corrected - for  chronic constipation complicated by supplemental iron, recommend Colace 100 mg 1 - 2x daily + hydration and fiber + Miralax or magnesium citrate prn             - if surgery is desired by family and to be considered, would recommend evaluation and management at tertiary facility  - if tolerating PO without pain or nausea and with adequate bowel function, consider outpatient [facility] follow-up             - palliative care consultation appreciated considering patient's and his family's stated goals  - medical management of comorbidities as per medical team - DVT prophylaxis and ambulation encouraged  All of the above findings and recommendations were discussed with the patient, his daughter, and medical physician, and all of patient's and his daughter's questions were answered to their expressed satisfaction.  Thank you for the opportunity to participate in this patient's care.   -- Marilynne Drivers Rosana Hoes, MD, [place]: [facility] - Partnering for exceptional care. Office: [phone]

## 2018-04-28 NOTE — Care Management (Signed)
Message left for [place] with [facility]. Per MD note [facility] is on diversion from yesterday. [facility] will call [facility] when bed is available.

## 2018-04-28 NOTE — Progress Notes (Signed)
[place] [facility] [place] NAME: Keith Davila    MR#:  [id number]  DATE OF BIRTH:  09-Jun-1929  SUBJECTIVE: denies abdominal pain. NG tube is taken out, started on clear liquids. Patient has lost stomach pain. Hold off on the transfer to be if patient is able to tolerate full liquids, software discharge home and follow-up with me as an outpatient as per discussion with Dr. Rosana Hoes from surgery and also discussed with patients daughter and the patient.   CHIEF COMPLAINT:   Chief Complaint  Patient presents with  . Loss of Consciousness  Patient has big hiatal hernia, gastric volvulus for long time usually patient is careful with his food intake.  Patient now has acute on chronic gastric volvulus.  Denies abdominal pain.   Patient  Told that he  has history of hiatal hernia for almost 17 years but never had nausea until this admission.   REVIEW OF SYSTEMS:   ROS CONSTITUTIONAL: No fever, fatigue or weakness.  EYES: No blurred or double vision.  EARS, NOSE, AND THROAT: No tinnitus or ear pain.  RESPIRATORY: no shortness of breath  cARDIOVASCULAR: No chest pain, orthopnea, edema.  GASTROINTESTINAL: No nausea, vomiting, diarrhea or abdominal pain.  GENITOURINARY: No dysuria, hematuria.  ENDOCRINE: No polyuria, nocturia,  HEMATOLOGY: No anemia, easy bruising or bleeding SKIN: No rash or lesion. MUSCULOSKELETAL: No joint pain or arthritis.   NEUROLOGIC: No tingling, numbness, weakness.  PSYCHIATRY: No anxiety or depression.   DRUG ALLERGIES:   Allergies  Allergen Reactions  . Prevacid [Lansoprazole]     VITALS:  Blood pressure (!) 148/83, pulse 93, temperature 98.1 F (36.7 C), temperature source Oral, resp. rate 17, height 6\' 2"  (1.88 m), weight 76.4 kg (168 lb 6.9 oz), SpO2 98 %.  PHYSICAL EXAMINATION:  GENERAL:  84 y.o.-year-old patient lying in the bed with no acute distress. Ng tube noted . EYES: Pupils equal, round, reactive  to light and accommodation. No scleral icterus. Extraocular muscles intact.  HEENT: Head atraumatic, normocephalic. Oropharynx and nasopharynx clear.  NECK:  Supple, no jugular venous distention. No thyroid enlargement, no tenderness.  LUNGS: Normal breath sounds bilaterally, no wheezing, rales,rhonchi or crepitation. No use of accessory muscles of respiration.  CARDIOVASCULAR: S1, S2 normal. No murmurs, rubs, or gallops.  ABDOMEN: Soft, nontender, nondistended. Bowel sounds present. No organomegaly or mass.  EXTREMITIES: No pedal edema, cyanosis, or clubbing.  NEUROLOGIC: Cranial nerves II through XII are intact. Muscle strength 5/5 in all extremities. Sensation intact. Gait not checked.  PSYCHIATRIC: The patient is alert and oriented x 3.  SKIN: No obvious rash, lesion, or ulcer.    LABORATORY PANEL:   CBC Recent Labs  Lab 04/27/18 0457  WBC 8.9  HGB 10.7*  HCT 32.5*  PLT 265   ------------------------------------------------------------------------------------------------------------------  Chemistries  Recent Labs  Lab 04/24/18 1209  04/28/18 0521  NA 141   < > 144  K 4.0   < > 3.7  CL 106   < > 108  CO2 27   < > 28  GLUCOSE 84   < > 205*  BUN 29*   < > 27*  CREATININE 1.23   < > 1.42*  CALCIUM 9.1   < > 7.7*  MG  --   --  1.9  AST 23  --   --   ALT 12  --   --   ALKPHOS 47  --   --   BILITOT 0.7  --   --    < > =  values in this interval not displayed.   ------------------------------------------------------------------------------------------------------------------  Cardiac Enzymes Recent Labs  Lab 04/25/18 0407  TROPONINI <0.03   ------------------------------------------------------------------------------------------------------------------  RADIOLOGY:  Dg Abd Acute W/chest  Result Date: 04/27/2018 CLINICAL DATA:  Gastric outlet obstruction. EXAM: DG ABDOMEN ACUTE W/ 1V CHEST COMPARISON:  Radiographs of April 26, 2018. FINDINGS: Stable cardiomegaly.  Atherosclerosis of thoracic aorta is noted. No pneumothorax is noted. Left lung is clear. Mild right basilar atelectasis is noted. Distal tip of nasogastric tube is seen in the stomach. No evidence of bowel obstruction or ileus. Phleboliths are noted in the pelvis. IMPRESSION: No evidence of bowel obstruction or ileus. Mild right basilar subsegmental atelectasis is noted. Distal tip of nasogastric tube seen in expected position of stomach. Aortic Atherosclerosis (ICD10-I70.0). Electronically Signed   By: Marijo Conception, M.D.   On: 04/27/2018 10:45    EKG:   Orders placed or performed during the hospital encounter of 04/24/18  . ED EKG  . ED EKG  . EKG 12-Lead  . EKG 12-Lead  . EKG    ASSESSMENT AND PLAN:   1. recurrent syncope likely vasovagal with orthostatic hypotension: ultrasound did not show an hemodynamically significant stenosis. Echocardiogram showed he is more than 55%., Discussed  Results  with patient's daughter.  2.  Hypokalemia, acute renal failure: Continue IV fluids and potassium supplementation.  3.  Acute on chronic gastric volvulus: Patient has history of large hiatal hernia, chronic gastric volvulus, has been having vomiting, abdominal pain, has acute gastric volvulus, status post NG tube decompression. Patient NG tube is taken out, tolerating clear liquids. Spoke with Dr. Rosana Hoes surgery recommended to see how he does with advancing the diet, likely discharge home and follow-up with the [place] as an out pt.daughter  And patient are agreeable to this plan.,cancel transfer to [place], pt is asking for medical management of his hiatal hernia, volvulus, ask the patient to discuss with his [place] doctors and also surgery.use PPI and reglan t.   4.  Known history of right kidney cancer, but patient daughter mentioned that he has had a biopsy done at [facility] and known history of sarcoidosis though this spots in the lungs are present before.  I spoke with Dr. Randa Evens who suggested that  patient already had a biopsy at around [facility] along with more test like CT chest, patient better served at more testing and continued care at [facility] rather than more testing here and daughter agrees for that.         #5 .diabetes mellitus type 2: Continue sliding scale insulin with coverage.  Hold Lantus.if po intake is better ,restart his home DM meds.   #6 .hypokalemia: Replace the potassium.    Marland Kitchen all the records are reviewed and case discussed with Care Management/Social Workerr. Management plans discussed with the patient, family and they are in agreement.  CODE STATUS: DNR  TOTAL TIME TAKING CARE OF THIS PATIENT: 38 minutes.  More than 50% time spent in counseling, coordination of care, discussing about the blood work that he had done at [facility] including right kidney biopsy, imaging, also discussing with Dr. Randa Evens from oncology and also I discussed extensively with patient's daughter. POSSIBLE D/C IN 1-2 DAYS, DEPENDING ON CLINICAL CONDITION.   Epifanio Lesches M.D on 04/28/2018 at 1:05 PM  Between 7am to 6pm - Pager - [phone]  After 6pm go to [email or web address] - [email or web address] [facility]  [place] [facility]  Office  [phone]  CC: Primary care physician;  Desiree Hane, PA   Note: This dictation was prepared with Dragon dictation along with smaller phrase technology. Any transcriptional errors that result from this process are unintentional.

## 2018-04-28 NOTE — Care Management Important Message (Signed)
Important Message  Patient Details  Name: Keith Davila MRN: [id number] Date of Birth: 04/16/1929   Medicare Important Message Given:  Yes    Juliann Pulse A Sahian Kerney 04/28/2018, 1:54 PM

## 2018-04-29 LAB — GLUCOSE, CAPILLARY
GLUCOSE-CAPILLARY: 225 mg/dL — AB (ref 70–99)
GLUCOSE-CAPILLARY: 231 mg/dL — AB (ref 70–99)
Glucose-Capillary: 209 mg/dL — ABNORMAL HIGH (ref 70–99)
Glucose-Capillary: 261 mg/dL — ABNORMAL HIGH (ref 70–99)
Glucose-Capillary: 346 mg/dL — ABNORMAL HIGH (ref 70–99)

## 2018-04-29 MED ORDER — GLUCERNA SHAKE PO LIQD
237.0000 mL | Freq: Two times a day (BID) | ORAL | Status: DC
  Administered 2018-04-29 – 2018-04-30 (×3): 237 mL via ORAL

## 2018-04-29 MED ORDER — FERROUS SULFATE 325 (65 FE) MG PO TABS
325.0000 mg | ORAL_TABLET | Freq: Two times a day (BID) | ORAL | Status: DC
  Administered 2018-04-29 – 2018-04-30 (×2): 325 mg via ORAL
  Filled 2018-04-29 (×2): qty 1

## 2018-04-29 MED ORDER — SENNA 8.6 MG PO TABS
1.0000 | ORAL_TABLET | Freq: Every day | ORAL | Status: DC
  Administered 2018-04-29 – 2018-04-30 (×2): 8.6 mg via ORAL
  Filled 2018-04-29 (×2): qty 1

## 2018-04-29 MED ORDER — LOSARTAN POTASSIUM 25 MG PO TABS
25.0000 mg | ORAL_TABLET | Freq: Every day | ORAL | Status: DC
  Administered 2018-04-29 – 2018-04-30 (×2): 25 mg via ORAL
  Filled 2018-04-29 (×2): qty 1

## 2018-04-29 MED ORDER — BRIMONIDINE TARTRATE 0.2 % OP SOLN
1.0000 [drp] | Freq: Three times a day (TID) | OPHTHALMIC | Status: DC
  Administered 2018-04-29 (×2): 1 [drp] via OPHTHALMIC
  Filled 2018-04-29: qty 5

## 2018-04-29 MED ORDER — ENSURE ENLIVE PO LIQD: 237.0000 mL | Freq: Two times a day (BID) | ORAL | Status: DC

## 2018-04-29 MED ORDER — SIMVASTATIN 20 MG PO TABS
40.0000 mg | ORAL_TABLET | Freq: Every day | ORAL | Status: DC
  Administered 2018-04-29: 40 mg via ORAL
  Filled 2018-04-29: qty 2

## 2018-04-29 MED ORDER — TIMOLOL MALEATE 0.5 % OP SOLN
1.0000 [drp] | Freq: Three times a day (TID) | OPHTHALMIC | Status: DC
  Filled 2018-04-29: qty 5

## 2018-04-29 MED ORDER — BRINZOLAMIDE 1 % OP SUSP
1.0000 [drp] | Freq: Three times a day (TID) | OPHTHALMIC | Status: DC
  Administered 2018-04-29 – 2018-04-30 (×3): 1 [drp] via OPHTHALMIC
  Filled 2018-04-29: qty 10

## 2018-04-29 MED ORDER — TIMOLOL MALEATE 0.5 % OP SOLN
1.0000 [drp] | Freq: Every day | OPHTHALMIC | Status: DC
  Administered 2018-04-30: 1 [drp] via OPHTHALMIC
  Filled 2018-04-29: qty 5

## 2018-04-29 MED ORDER — FAMOTIDINE 20 MG PO TABS
20.0000 mg | ORAL_TABLET | Freq: Two times a day (BID) | ORAL | Status: DC
  Administered 2018-04-29 – 2018-04-30 (×3): 20 mg via ORAL
  Filled 2018-04-29 (×3): qty 1

## 2018-04-29 NOTE — Progress Notes (Signed)
[name] NAME: Keith Davila    MR#:  [id number]  DATE OF BIRTH:  October 26, 1928  SUBJECTIVE: diet advanced to soft diet surgery. Patient denies any abdominal pain or nausea.   CHIEF COMPLAINT:   Chief Complaint  Patient presents with  . Loss of Consciousness  Patient has big hiatal hernia, gastric volvulus for long time usually patient is careful with his food intake.  Patient now has acute on chronic gastric volvulus.     Patient  Told that he  has history of hiatal hernia for almost 17 years but never had nausea until this admission.   REVIEW OF SYSTEMS:   ROS CONSTITUTIONAL: No fever, fatigue or weakness.  EYES: No blurred or double vision.  EARS, NOSE, AND THROAT: No tinnitus or ear pain.  RESPIRATORY: no shortness of breath  cARDIOVASCULAR: No chest pain, orthopnea, edema.  GASTROINTESTINAL: No nausea, vomiting, diarrhea or abdominal pain.  GENITOURINARY: No dysuria, hematuria.  ENDOCRINE: No polyuria, nocturia,  HEMATOLOGY: No anemia, easy bruising or bleeding SKIN: No rash or lesion. MUSCULOSKELETAL: No joint pain or arthritis.   NEUROLOGIC: No tingling, numbness, weakness.  PSYCHIATRY: No anxiety or depression.   DRUG ALLERGIES:   Allergies  Allergen Reactions  . Prevacid [Lansoprazole]     VITALS:  Blood pressure (!) 142/92, pulse 76, temperature 98.3 F (36.8 C), temperature source Oral, resp. rate 18, height 6\' 2"  (1.88 m), weight 76.4 kg (168 lb 6.9 oz), SpO2 98 %.  PHYSICAL EXAMINATION:  GENERAL:  84 y.o.-year-old patient lying in the bed with no acute distress. Ng tube noted . EYES: Pupils equal, round, reactive to light and accommodation. No scleral icterus. Extraocular muscles intact.  HEENT: Head atraumatic, normocephalic. Oropharynx and nasopharynx clear.  NECK:  Supple, no jugular venous distention. No thyroid enlargement, no tenderness.  LUNGS: Normal breath sounds bilaterally, no  wheezing, rales,rhonchi or crepitation. No use of accessory muscles of respiration.  CARDIOVASCULAR: S1, S2 normal. No murmurs, rubs, or gallops.  ABDOMEN: Soft, nontender, nondistended. Bowel sounds present. No organomegaly or mass.  EXTREMITIES: No pedal edema, cyanosis, or clubbing.  NEUROLOGIC: Cranial nerves II through XII are intact. Muscle strength 5/5 in all extremities. Sensation intact. Gait not checked.  PSYCHIATRIC: The patient is alert and oriented x 3.  SKIN: No obvious rash, lesion, or ulcer.    LABORATORY PANEL:   CBC Recent Labs  Lab 04/27/18 0457  WBC 8.9  HGB 10.7*  HCT 32.5*  PLT 265   ------------------------------------------------------------------------------------------------------------------  Chemistries  Recent Labs  Lab 04/24/18 1209  04/28/18 0521  NA 141   < > 144  K 4.0   < > 3.7  CL 106   < > 108  CO2 27   < > 28  GLUCOSE 84   < > 205*  BUN 29*   < > 27*  CREATININE 1.23   < > 1.42*  CALCIUM 9.1   < > 7.7*  MG  --   --  1.9  AST 23  --   --   ALT 12  --   --   ALKPHOS 47  --   --   BILITOT 0.7  --   --    < > = values in this interval not displayed.   ------------------------------------------------------------------------------------------------------------------  Cardiac Enzymes Recent Labs  Lab 04/25/18 0407  TROPONINI <0.03   ------------------------------------------------------------------------------------------------------------------  RADIOLOGY:  No results found.  EKG:   Orders placed or performed during the  hospital encounter of 04/24/18  . ED EKG  . ED EKG  . EKG 12-Lead  . EKG 12-Lead  . EKG    ASSESSMENT AND PLAN:   1. recurrent syncope likely vasovagal with orthostatic hypotension: ultrasound did not show an hemodynamically significant stenosis. Echocardiogram showed he is more than 55%., Discussed  Results  with patient's daughter. Slight orthostatic hypotension    2.  Hypokalemia, acute renal  failure: improved, recheck kidney function today.  3.  Acute on chronic gastric volvulus: Patient has history of large hiatal hernia, chronic gastric volvulus, has been having vomiting, abdominal pain, has acute gastric volvulus, status post NG tube decompression. Patient NG tube is taken out, advanced to soft diet today. likely discharge home tomorrow.spoke with patient'sdaughter  . 4.  Known history of right kidney cancer, but patient daughter mentioned that he has had a biopsy done at [facility] and known history of sarcoidosis though this spots in the lungs are present before.  I spoke with Dr. Randa Evens who suggested that patient already had a biopsy at around [facility] along with more test like CT chest, patient better served at more testing and continued care at [facility] rather than more testing here and daughter agrees for that.    #5 .diabetes mellitus type 2: Continue sliding scale insulin with coverage.  Hold Lantus. Until po intake is better , restart Lantus seven units discharge, NovoLog 4 units TID at discharge.doses will decreased, due to  His unpredictable po  Intake,discussed with patient daughter.  #6 .hypokalemia: Replaced the potassium. Improved  7.hypomagnesemia;mg being replaced.  Marland Kitchen all the records are reviewed and case discussed with Care Management/Social Workerr. Management plans discussed with the patient, family and they are in agreement.  CODE STATUS: DNR  TOTAL TIME TAKING CARE OF THIS PATIENT: 38 minutes.  More than 50% time spent in counseling, coordination of care, discussing about the blood work that he had done at [facility] including right kidney biopsy, imaging, also discussing with Dr. Randa Evens from oncology and also I discussed extensively with patient's daughter. POSSIBLE D/C IN 1-2 DAYS, DEPENDING ON CLINICAL CONDITION.   Epifanio Lesches M.D on 04/29/2018 at 1:01 PM  Between 7am to 6pm - Pager - [phone]  After 6pm go to [email or web address] - password  [facility]  Office  [phone]  CC: Primary care physician; Desiree Hane, PA   Note: This dictation was prepared with Dragon dictation along with smaller phrase technology. Any transcriptional errors that result from this process are unintentional.

## 2018-04-29 NOTE — Progress Notes (Signed)
SURGICAL PROGRESS NOTE (cpt [phone])  Hospital Day(s): 4.   Post op day(s): 5 Days Post-Op.   Interval History: Patient seen and examined, no acute events or new complaints overnight. Patient reports he continues to feel well and has tolerated both clear liquids diet and full liquids diet without any further abdominal pain, N/V, fever/chills, CP, or SOB. He and his daughter express they would like for patient to be discharged home tomorrow with [facility] outpatient follow-up and continued treatment only for symptoms.  Review of Systems:  Constitutional: denies fever, chills  HEENT: denies cough or congestion  Respiratory: denies any shortness of breath  Cardiovascular: denies chest pain or palpitations  Gastrointestinal: abdominal pain, N/V, and bowel function as per interval history Genitourinary: denies burning with urination or urinary frequency Musculoskeletal: denies pain, decreased motor or sensation Integumentary: denies any other rashes or skin discolorations Neurological: denies HA or vision/hearing changes   Vital signs in last 24 hours: [min-max] current  Temp:  [98.1 F (36.7 C)-98.8 F (37.1 C)] 98.3 F (36.8 C) (07/11 0432) Pulse Rate:  [76-93] 76 (07/11 0432) Resp:  [18-19] 18 (07/11 0432) BP: (119-148)/(66-92) 142/92 (07/11 0432) SpO2:  [98 %-100 %] 98 % (07/11 0432)     Height: 6\' 2"  (188 cm) Weight: 168 lb 6.9 oz (76.4 kg) BMI (Calculated): 21.62   Intake/Output this shift:  No intake/output data recorded.   Intake/Output last 2 shifts:  @IOLAST2SHIFTS @   Physical Exam:  Constitutional: alert, cooperative and no distress  HENT: normocephalic without obvious abnormality  Eyes: PERRL, EOM's grossly intact and symmetric  Neuro: CN II - XII grossly intact and symmetric without deficit  Respiratory: breathing non-labored at rest  Cardiovascular: regular rate and sinus rhythm  Gastrointestinal: soft, completely non-tender, and non-distended Musculoskeletal: UE and LE  FROM, no edema or wounds, motor and sensation grossly intact, NT   Labs:  CBC Latest Ref Rng & Units 04/27/2018 04/26/2018 04/25/2018  WBC 3.8 - 10.6 K/uL 8.9 7.9 7.5  Hemoglobin 13.0 - 18.0 g/dL 10.7(L) 10.2(L) 9.6(L)  Hematocrit 40.0 - 52.0 % 32.5(L) 30.6(L) 29.1(L)  Platelets 150 - 440 K/uL 265 234 245   CMP Latest Ref Rng & Units 04/28/2018 04/27/2018 04/26/2018  Glucose 70 - 99 mg/dL 205(H) 161(H) 147(H)  BUN 8 - 23 mg/dL 27(H) 21 22  Creatinine 0.61 - 1.24 mg/dL 1.42(H) 1.11 1.20  Sodium 135 - 145 mmol/L 144 146(H) 145  Potassium 3.5 - 5.1 mmol/L 3.7 3.3(L) 3.3(L)  Chloride 98 - 111 mmol/L 108 106 106  CO2 22 - 32 mmol/L 28 26 29   Calcium 8.9 - 10.3 mg/dL 7.7(L) 8.5(L) 8.4(L)  Total Protein 6.5 - 8.1 g/dL - - -  Total Bilirubin 0.3 - 1.2 mg/dL - - -  Alkaline Phos 38 - 126 U/L - - -  AST 15 - 41 U/L - - -  ALT 0 - 44 U/L - - -   Imaging studies: No new pertinent imaging studies   Assessment/Plan: (ICD-10's:K56.2,K44.0) 84 y.o.malewith chronic large and progressively symptomatic hiatal hernia with likewise chronic gastric volvulus, complicated byalso known and untreated enlargedand possibly metastatic now 6 cm Right renal cell carcinoma and bypertinent comorbidities includingDM, HTN, HLD, peripheral arterial disease, aortic ectasia, CKD, COPD not on home supplemental oxygen, chronic anemia, GERD, prostate cancer, and osteoarthritis.              - soft diet ordered, instructed patient to chew well small portions + liquid nutritional shake supplements up to TID             -  patient and his daughter express understanding that dysphasia and emesis will likely eventually recur if not surgically corrected - for chronic constipation complicated by supplemental iron, recommend Colace 100 mg 1 - 2x daily + hydration and fiber + Miralax or magnesium citrate prn             - discharge planning if tolerating PO without pain or nausea and with adequate bowel function, consider  [facility] - if surgery is desired by family and to be considered, would recommend evaluation and management at tertiary facility - palliative care consultation appreciated considering patient's and his family's stated goals             - medical management of comorbidities as per medical team - DVT prophylaxis and ambulation encouraged  All of the above findings and recommendations were discussed with the patient, his daughter, and medicalphysician,and all of patient's and his daughter's questions were answered to their expressed satisfaction.  Thank you for the opportunity to participate in this patient's care.  -- Marilynne Drivers Rosana Hoes, MD, [place]: [facility] - Partnering for exceptional care. Office: [phone]

## 2018-04-30 LAB — GLUCOSE, CAPILLARY
GLUCOSE-CAPILLARY: 279 mg/dL — AB (ref 70–99)
Glucose-Capillary: 209 mg/dL — ABNORMAL HIGH (ref 70–99)

## 2018-04-30 LAB — BASIC METABOLIC PANEL
Anion gap: 6 (ref 5–15)
BUN: 20 mg/dL (ref 8–23)
CHLORIDE: 105 mmol/L (ref 98–111)
CO2: 26 mmol/L (ref 22–32)
CREATININE: 1.02 mg/dL (ref 0.61–1.24)
Calcium: 7.6 mg/dL — ABNORMAL LOW (ref 8.9–10.3)
GFR calc non Af Amer: >60 mL/min (ref >60)
Glucose, Bld: 223 mg/dL — ABNORMAL HIGH (ref 70–99)
Potassium: 3.9 mmol/L (ref 3.5–5.1)
Sodium: 137 mmol/L (ref 135–145)

## 2018-04-30 MED ORDER — INSULIN GLARGINE 100 UNITS/ML SOLOSTAR PEN: 10.0000 [IU] | PEN_INJECTOR | Freq: Every evening | SUBCUTANEOUS | 11 refills | Status: DC

## 2018-04-30 MED ORDER — GLUCERNA SHAKE PO LIQD: 237.0000 mL | Freq: Two times a day (BID) | ORAL | 0 refills | Status: DC

## 2018-04-30 MED ORDER — INSULIN ASPART 100 UNIT/ML ~~LOC~~ SOLN: SUBCUTANEOUS | 11 refills | Status: DC

## 2018-04-30 NOTE — Discharge Summary (Signed)
Keith Davila, is a 84 y.o. male  DOB Apr 04, 1929  MRN [id number].  Admission date:  04/24/2018  Admitting Physician  Amelia Jo, MD  Discharge Date:  04/30/2018   Primary MD  Desiree Hane, PA  Recommendations for primary care physician for things to follow:   Follow-up with physician at the [facility] hospital in one week follow-up with the primary oncologist at [facility] in the [place]  one week.   Admission Diagnosis  Dehydration [E86.0] Gastric volvulus [K31.89] Syncope, unspecified syncope type [R55]   Discharge Diagnosis  Dehydration [E86.0] Gastric volvulus [K31.89] Syncope, unspecified syncope type [R55]    Active Problems:   Gastric volvulus   Palliative care by specialist   DNR (do not resuscitate)   Gastric outlet obstruction   Renal cell carcinoma ([place])   Weakness generalized      Past Medical History:  Diagnosis Date  . Anemia   . Arthritis   . Cancer [facility])    prostate  . COPD (chronic obstructive pulmonary disease) ([place])   . Diabetes mellitus without complication ([place])   . GERD (gastroesophageal reflux disease)   . Hyperlipidemia   . Hypertension   . MRSA (methicillin resistant staph aureus) culture positive 2009  . Peripheral vascular disease ([place])   . Pneumonia   . prostate   . Renal disorder   . Varicose veins     Past Surgical History:  Procedure Laterality Date  . BACK SURGERY    . ESOPHAGOGASTRODUODENOSCOPY (EGD) WITH PROPOFOL N/A 12/17/2016   Procedure: ESOPHAGOGASTRODUODENOSCOPY (EGD) WITH PROPOFOL;  Surgeon: Manya Silvas, MD;  Location: [facility] ENDOSCOPY;  Service: Endoscopy;  Laterality: N/A;  . HERNIA REPAIR    . JOINT REPLACEMENT    . SPINE SURGERY    . TILT TABLE STUDY N/A 02/11/2017   Procedure: Tilt Table Study;  Surgeon: Sanda Klein, MD;  Location: [place] [facility];   Service: Cardiovascular;  Laterality: N/A;       History of present illness and  Hospital Course:     Kindly see H&P for history of present illness and admission details, please review complete Labs, Consult reports and Test reports for all details in brief  HPI  from the history and physical done on the day of admission 84 year old male patient came to [facility] today because of recurrent syncopal events that while eating at lunch. He had abdominal pain, vomiting after he ate [place] sandwich in the emergency room. Patient CT abdomen showed gastric volvulus. Patient admitted for recurrent syncope, acute on chronic gastric volvulus.   Hospital course: 1. Acute on chronic dark gastric wellness: patient has chronic large and progressively sympathetic hiatal hernia and, chronic gastric wellness for about 12 to 13 years. Patient admitted to medical service, had NG tube decompression . Almost 6 days. Seen by surgery, followed by them. Patient repeat abdominal x-rays that showed improvement of the distention, NG tube is taken out, patient has no further vomiting, diet has been started back initially patient was kept in PO. Patient started on clear liquids, ad insert the right to full liquids, change it to solve diet by surgery, patient is tolerating their diet so far, no abdominal pain. [facility] his hiatal her[facility], gastric volvulus and also followed by PCP. So the surgery Dr. recommended that patient can follow with PCP in [facility] .  2. recurrent syncope likely vasovagal with orthostatic hypotension: ultrasound did not show an hemodynamically significant stenosis. Echocardiogram showed he is more than  55%., Discussed  Results  with patient's daughter. Slight orthostatic hypotension present.  daughter has Charity fundraiser for him.    Known history of right kidney cancer, but patient daughter mentioned that he has had a biopsy done at [facility] and known history of  sarcoidosis though this spots in the lungs are present before.  I spoke with Dr. Randa Evens who suggested that patient already had a biopsy at around [facility] along with more test like CT chest, patient better served at more testing and continued care at [facility] rather than more testing here and daughter agrees for that.    #5 .diabetes mellitus type 2: Continue sliding scale insulin with coverage. Lantuss dose and Novo log dose has been adjusted,  #6 .hypokalemia: Replaced the potassium. Improved  7.hypomagnesemia;mg being replaced. 8, essential hypertension: control 9. history of COPD: continue home dose inhalers. No wheezing  for chronic constipation complicated by supplemental iron, recommend Colace 100 mg 1 - 2x daily + hydration and fiber + Miralax or magnesium citrate prn  .  Original plan is to transfer the patient to the [facility] jhat's been canceled because patient NG tube is taken out and he is tolerating the diet and he'll be discharged home. Discussed with patients daughter.  Discharge Condition: stable   Follow UP  Follow-up Information    Family physician at [facility]. Schedule an appointment as soon as possible for a visit in 1 week(s).        Family oncologist at [facility]. Schedule an appointment as soon as possible for a visit in 1 week(s).             Discharge Instructions  and  Discharge Medications  soft diet ordered, instructed patient to chew well small portions + liquid nutritional shake supplements up to TID      Allergies as of 04/30/2018      Reactions   Prevacid [lansoprazole]       Medication List    TAKE these medications   acetaminophen 325 MG tablet Commonly known as:  TYLENOL Take 650 mg by mouth every 4 (four) hours as needed. for pain/ increased temp. May be administered orally, per G-tube if needed or rectally if unable to swallow (separate order). Maximum dose for 24 hours is 3,000 mg from all sources of Acetaminophen/ Tylenol   albuterol  108 (90 Base) MCG/ACT inhaler Commonly known as:  PROVENTIL HFA;VENTOLIN HFA Inhale 2 puffs into the lungs 3 (three) times daily as needed for wheezing or shortness of breath.   aspirin 81 MG tablet Take 81 mg by mouth daily.   Cinnamon 500 MG capsule Take 1,000 mg by mouth 2 (two) times daily.   feeding supplement (GLUCERNA SHAKE) Liqd Take 237 mLs by mouth 2 (two) times daily between meals.   ferrous sulfate 325 (65 FE) MG tablet Take 325 mg by mouth 2 (two) times daily.   insulin aspart 100 UNIT/ML injection Commonly known as:  NOVOLOG Inject 4u every morning with breakfast, 6 daily with lunch and 4u with dinner What changed:  additional instructions   insulin glargine 100 unit/mL Sopn Commonly known as:  LANTUS Inject 0.1 mLs (10 Units total) into the skin every evening. What changed:  how much to take   latanoprost 0.005 % ophthalmic solution Commonly known as:  XALATAN Place 1 drop into both eyes at bedtime.   losartan 25 MG tablet Commonly known as:  COZAAR Take 1 tablet (25 mg total) by mouth daily.   senna  8.6 MG Tabs tablet Commonly known as:  SENOKOT Take 1 tablet by mouth every morning.   SIMBRINZA 1-0.2 % Susp Generic drug:  Brinzolamide-Brimonidine Place 1 drop into the right eye 3 (three) times daily.   simvastatin 40 MG tablet Commonly known as:  ZOCOR Take 40 mg by mouth every evening.   tamsulosin 0.4 MG Caps capsule Commonly known as:  FLOMAX Take 0.4 mg by mouth every evening.   timolol 0.5 % ophthalmic solution Commonly known as:  BETIMOL Place 1 drop into the right eye daily.   vitamin B-12 1000 MCG tablet Commonly known as:  CYANOCOBALAMIN Take 1,000 mcg by mouth daily.   vitamin C 500 MG tablet Commonly known as:  ASCORBIC ACID Take 500 mg by mouth 2 (two) times daily.         Diet and Activity recommendation: See Discharge Instructions above   Consults obtained - surgery, palliative care, case manager   Major procedures  and Radiology Reports - PLEASE review detailed and final reports for all details, in brief -     Ct Abdomen Pelvis W Contrast  Result Date: 04/24/2018 CLINICAL DATA:  Vomiting. EXAM: CT ABDOMEN AND PELVIS WITH CONTRAST TECHNIQUE: Multidetector CT imaging of the abdomen and pelvis was performed using the standard protocol following bolus administration of intravenous contrast. CONTRAST:  179mL OMNIPAQUE IOHEXOL 300 MG/ML  SOLN COMPARISON:  None. FINDINGS: Lower chest: Calcified granulomas are seen lung bases. There are several uncalcified nodules as well. Atelectasis adjacent to a large hiatal hernia. No pneumonia or aspiration. There is a large hiatal hernia which is fluid-filled and dilated. The hernia displaces the heart anteriorly. Coronary artery calcifications are noted. There appears to be adenopathy in the right hilum, incompletely evaluated. Hepatobiliary: There is a wedge-shaped region of decreased attenuation in the liver such as on series 2, image 21 which dissipates on delayed imaging consistent with a perfusion anomaly of no acute significance. There is a mass in the right hepatic lobe on series 2, image 15 measuring 3.2 cm with an attenuation of 28 Hounsfield units. This mass persists on delayed imaging. No other liver masses identified. The portal vein is not well assessed on initial imaging but is opacified on delayed imaging. No portal vein thrombosis noted. The gallbladder is normal. Pancreas: Pancreatic duct is mildly prominent but pancreas is otherwise normal. No evidence of pancreatitis or pancreatic mass. Spleen: Normal in size without focal abnormality. Adrenals/Urinary Tract: There is an indeterminate left adrenal nodule measuring 14 mm. There is a small right adrenal nodule which is indeterminate as well measuring 11 mm. There is a mass in the posterior right kidney on series 2, image 22 measuring up to 6 cm consistent with malignancy. There may be some subtle nodularity adjacent to the  kidney as well on series 2, image 21. There is a more subtle region of decreased attenuation anteriorly in the right kidney on series 2, image 21. Another small malignancy is not excluded although this region is indeterminate. There are bilateral renal cysts. No hydronephrosis. No ureteral stones. The bladder is normal. Stomach/Bowel: The hiatal hernia which is quite large is distended and fluid-filled. The stomach become smaller in caliber as it passes into the abdomen as seen on series 2, image 19. The stomach below the diaphragm is twisted with the gastric outlet on the left as seen on series 2, image 18 and coronal image 33. The duodenum then makes 180 degree turn back to the right. The duodenum is decompressed. No small  bowel obstruction. There is a stool ball in the rectum. The sigmoid colon extends into the upper abdomen between the peritoneum and the dilated stomach. No convincing evidence of sigmoid volvulus the remainder of the colon is unremarkable. Visualized appendix is unremarkable. Vascular/Lymphatic: The abdominal aorta is tortuous. The infrarenal abdominal aorta measures 2.6 cm. Atherosclerotic changes extend from the abdominal aorta into the iliac and femoral vessels. No adenopathy in the abdomen. Reproductive: Brachytherapy seeds seen in the prostate. Other: There is a fat containing ventral hernia. No free air or free fluid. Musculoskeletal: Degenerative changes seen in the spine. IMPRESSION: 1. There is a gastric volvulus. There is clearly a volvulus centered at the gastric outlet which is located in the left upper quadrant. There is also a distended fluid-filled hiatal hernia. The stomach becomes smaller in caliber as the hernia extends through the hiatus but there is no definitive volvulus at the level of the hiatus. 2. 6 cm right renal carcinoma. There may be some mild nodularity adjacent to the left kidney in this region as well. A more subtle region anteriorly in the left kidney could  represent complicated cystic change versus another tiny malignancy. MRI could better assess. 3. Apparent adenopathy in the right hilum. Given the right renal malignancy, the findings are concerning for metastatic disease but incompletely evaluated. 4. Multiple pulmonary nodules. Many are calcified consistent with previous granulomatous disease. Some are not calcified. I suspect the uncalcified nodules may simply represent uncalcified granulomas but metastatic disease would be difficult to completely exclude. 5. There is an indeterminate mass in the liver described above. This is not fill on in on delayed images and is not a typical hemangioma. It could represent a complicated cyst. However, metastatic lesion is not completely excluded. 6. Redundant sigmoid colon. The sigmoid colon is mildly distended between the dilated stomach an the peritoneum but there is no definitive volvulus in this region. 7. Indeterminate adrenal nodules. While adenomas are possible, given the malignancy in the kidney, metastatic disease should be considered. 8. Mildly tortuous atherosclerotic abdominal aorta. Ectatic abdominal aorta at risk for aneurysm development. Recommend followup by ultrasound in 5 years. This recommendation follows ACR consensus guidelines: White Paper of the ACR Incidental Findings Committee II on Vascular Findings. J Am Coll Radiol 2013; 10:789-794. The patient may benefit from a PET-CT to further evaluate the apparent adenopathy in the right hilum, the mass in the liver, in the adrenal nodularity. This could be done non emergently. Findings called to Dr. Archie Balboa. Electronically Signed   By: Dorise Bullion III M.D   On: 04/24/2018 21:23   US Carotid Bilateral  Result Date: 04/26/2018 CLINICAL DATA:  Syncopal episode. History of hypertension, hyperlipidemia and diabetes EXAM: BILATERAL CAROTID DUPLEX ULTRASOUND TECHNIQUE: Pearline Cables scale imaging, color Doppler and duplex ultrasound were performed of bilateral carotid  and vertebral arteries in the neck. COMPARISON:  Carotid Doppler ultrasound - 05/15/2015 FINDINGS: Criteria: Quantification of carotid stenosis is based on velocity parameters that correlate the residual internal carotid diameter with NASCET-based stenosis levels, using the diameter of the distal internal carotid lumen as the denominator for stenosis measurement. The following velocity measurements were obtained: RIGHT ICA:  57/15 cm/sec CCA:  53/97 cm/sec SYSTOLIC ICA/CCA RATIO:  0.6 ECA:  60 cm/sec LEFT ICA:  76/20 cm/sec CCA:  673/41 cm/sec SYSTOLIC ICA/CCA RATIO:  0.7 ECA:  53 cm/sec RIGHT CAROTID ARTERY: There is a minimal amount of eccentric echogenic plaque within the right carotid bulb (image 17), extending to involve the origin and proximal  aspects of the right internal carotid artery (image 25), not resulting in elevated peak systolic velocities within the interrogated course of the right internal carotid artery to suggest a hemodynamically significant stenosis. RIGHT VERTEBRAL ARTERY:  Antegrade flow LEFT CAROTID ARTERY: There is a minimal amount of scattered atherosclerotic plaque seen throughout the left common carotid artery (images 41, 42, 45 and 46). There is a moderate amount of eccentric mixed echogenic plaque within the left carotid bulb (image 51 and 52), extending to involve the origin and proximal aspects of the left internal carotid artery (image 62), not resulting in elevated peak systolic velocities within the interrogated course of the left internal carotid artery to suggest a hemodynamically significant stenosis. LEFT VERTEBRAL ARTERY:  Antegrade flow IMPRESSION: Minimal to moderate amount of bilateral atherosclerotic plaque, left greater than right, not resulting in a hemodynamically significant stenosis within either internal carotid artery. Electronically Signed   By: Sandi Mariscal M.D.   On: 04/26/2018 12:09   Dg Chest Portable 1 View  Result Date: 04/24/2018 CLINICAL DATA:  Initial  evaluation status post NG tube placement. EXAM: PORTABLE CHEST 1 VIEW COMPARISON:  Prior radiograph from 05/06/2015. FINDINGS: NG tube seen coursing inferiorly into the upper stomach with nonvisualization of the tip. Cardiomegaly, stable from previous. Mediastinal silhouette within normal limits. Aortic atherosclerosis. Lungs are hypoinflated. Small bilateral pleural effusions. Perihilar vascular congestion without frank pulmonary edema. No definite focal airspace disease. No pneumothorax. No acute osseus abnormality. IMPRESSION: 1. Enteric tube in place, seen coursing into the upper abdomen with nonvisualization of the tip. 2. Cardiomegaly with perihilar vascular congestion and probable small bilateral pleural effusions. 3. Aortic atherosclerosis. Electronically Signed   By: Jeannine Boga M.D.   On: 04/24/2018 22:57   Dg Abd Acute W/chest  Result Date: 04/27/2018 CLINICAL DATA:  Gastric outlet obstruction. EXAM: DG ABDOMEN ACUTE W/ 1V CHEST COMPARISON:  Radiographs of April 26, 2018. FINDINGS: Stable cardiomegaly. Atherosclerosis of thoracic aorta is noted. No pneumothorax is noted. Left lung is clear. Mild right basilar atelectasis is noted. Distal tip of nasogastric tube is seen in the stomach. No evidence of bowel obstruction or ileus. Phleboliths are noted in the pelvis. IMPRESSION: No evidence of bowel obstruction or ileus. Mild right basilar subsegmental atelectasis is noted. Distal tip of nasogastric tube seen in expected position of stomach. Aortic Atherosclerosis (ICD10-I70.0). Electronically Signed   By: Marijo Conception, M.D.   On: 04/27/2018 10:45   Dg Abd Acute W/chest  Result Date: 04/26/2018 CLINICAL DATA:  Gastric outlet obstruction EXAM: DG ABDOMEN ACUTE W/ 1V CHEST COMPARISON:  CT abdomen 04/24/2018 FINDINGS: There is a nasogastric tube with the tip projecting over the stomach. There is a hiatal hernia. There is relative decreased distension of the stomach compared with the prior CT of  the abdomen. No radiopaque calculi or other significant radiographic abnormality is seen. 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 The lungs are hyperinflated likely secondary to COPD. There is a calcified right lower lobe pulmonary nodule consistent with prior granulomatous disease. There is no focal parenchymal opacity. There is no pleural effusion or pneumothorax. There is stable cardiomegaly. There is posterior spinal fusion from L3 through S1. IMPRESSION: There is a nasogastric tube with the tip projecting over the stomach. There is relative decreased distension of the stomach compared with the prior CT of the abdomen. There is interval improved aeration of bilateral lungs. Electronically Signed   By: Kathreen Devoid   On: 04/26/2018 11:47    Micro Results  Recent Results (from the past 240 hour(s))  MRSA PCR Screening     Status: None   Collection Time: 04/25/18  2:39 AM  Result Value Ref Range Status   MRSA by PCR NEGATIVE NEGATIVE Final    Comment:        The GeneXpert MRSA Assay (FDA approved for NASAL specimens only), is one component of a comprehensive MRSA colonization surveillance program. It is not intended to diagnose MRSA infection nor to guide or monitor treatment for MRSA infections. Performed at [facility], [address] [place], [place] [address]        Today   Subjective:   Jerrelle Michelsen today has no headache,no chest abdominal pain,no new weakness tingling or numbness, feels much better wants to go home today.  Objective:   Blood pressure 127/69, pulse 80, temperature 99 F (37.2 C), temperature source Oral, resp. rate 18, height 6\' 2"  (1.88 m), weight 76.4 kg (168 lb 6.9 oz), SpO2 97 %.   Intake/Output Summary (Last 24 hours) at 04/30/2018 1003 Last data filed at 04/29/2018 1915 Gross per 24 hour  Intake 717 ml  Output -  Net 717 ml    Exam Awake Alert, Oriented x 3, No new F.N deficits, Normal  affect [place].AT,PERRAL Supple Neck,No JVD, No cervical lymphadenopathy appriciated.  Symmetrical Chest wall movement, Good air movement bilaterally, CTAB RRR,No Gallops,Rubs or new Murmurs, No Parasternal Heave +ve B.Sounds, Abd Soft, Non tender, No organomegaly appriciated, No rebound -guarding or rigidity. No Cyanosis, Clubbing or edema, No new Rash or bruise  Data Review   CBC w Diff:  Lab Results  Component Value Date   WBC 8.9 04/27/2018   HGB 10.7 (L) 04/27/2018   HCT 32.5 (L) 04/27/2018   PLT 265 04/27/2018    CMP:  Lab Results  Component Value Date   NA 137 04/30/2018   K 3.9 04/30/2018   CL 105 04/30/2018   CO2 26 04/30/2018   BUN 20 04/30/2018   CREATININE 1.02 04/30/2018   PROT 6.7 04/24/2018   ALBUMIN 3.6 04/24/2018   BILITOT 0.7 04/24/2018   ALKPHOS 47 04/24/2018   AST 23 04/24/2018   ALT 12 04/24/2018  .   Total Time in preparing paper work, data evaluation and todays exam - 35 minutes  Epifanio Lesches M.D on 04/30/2018 at 10:03 AM    Note: This dictation was prepared with Dragon dictation along with smaller phrase technology. Any transcriptional errors that result from this process are unintentional.

## 2018-04-30 NOTE — Progress Notes (Signed)
Patient discharged home via wheelchair by volunteer services. Madlyn Frankel, RN

## 2018-04-30 NOTE — Progress Notes (Signed)
Soft diet education/handout provided to patient. Madlyn Frankel, RN

## 2018-04-30 NOTE — Progress Notes (Signed)
Discharge instructions given and went over with patient and patients granddaughter at bedside. Prescriptions given and reviewed. All questions answered. Patient to discharge after lunch when caregiver is available. Madlyn Frankel, RN

## 2018-04-30 NOTE — Care Management Important Message (Signed)
Important Message  Patient Details  Name: Keith Davila MRN: [id number] Date of Birth: December 11, 1928   Medicare Important Message Given:  Yes    Juliann Pulse A Ulisses Vondrak 04/30/2018, 11:06 AM

## 2018-05-09 DIAGNOSIS — K440 Diaphragmatic hernia with obstruction, without gangrene: Secondary | ICD-10-CM

## 2018-05-12 ENCOUNTER — Encounter
Admission: RE | Admit: 2018-05-12 | Discharge: 2018-05-12 | Disposition: A | Discharge status: Home / Self Care | Payer: Medicare Other | Source: Ambulatory Visit | Attending: Internal Medicine | Admitting: Internal Medicine

## 2018-09-29 ENCOUNTER — Ambulatory Visit: Payer: Medicare Other | Admitting: Urology

## 2020-10-24 ENCOUNTER — Emergency Department: Payer: Medicare Other

## 2020-10-24 ENCOUNTER — Other Ambulatory Visit: Payer: Self-pay

## 2020-10-24 ENCOUNTER — Emergency Department
Admission: EM | Admit: 2020-10-24 | Discharge: 2020-10-24 | Disposition: A | Discharge status: Home / Self Care | Payer: Medicare Other | Attending: Emergency Medicine | Admitting: Emergency Medicine

## 2020-10-24 DIAGNOSIS — R202 Paresthesia of skin: Secondary | ICD-10-CM | POA: Diagnosis present

## 2020-10-24 DIAGNOSIS — R531 Weakness: Secondary | ICD-10-CM | POA: Insufficient documentation

## 2020-10-24 DIAGNOSIS — Z5321 Procedure and treatment not carried out due to patient leaving prior to being seen by health care provider: Secondary | ICD-10-CM | POA: Insufficient documentation

## 2020-10-24 DIAGNOSIS — M25551 Pain in right hip: Secondary | ICD-10-CM | POA: Insufficient documentation

## 2020-10-24 LAB — COMPREHENSIVE METABOLIC PANEL
ALT: 10 U/L (ref 0–44)
AST: 15 U/L (ref 15–41)
Albumin: 3.6 g/dL (ref 3.5–5.0)
Alkaline Phosphatase: 66 U/L (ref 38–126)
Anion gap: 11 (ref 5–15)
BUN: 25 mg/dL — ABNORMAL HIGH (ref 8–23)
CO2: 27 mmol/L (ref 22–32)
Calcium: 8.3 mg/dL — ABNORMAL LOW (ref 8.9–10.3)
Chloride: 103 mmol/L (ref 98–111)
Creatinine, Ser: 1.46 mg/dL — ABNORMAL HIGH (ref 0.61–1.24)
GFR, Estimated: 46 mL/min — ABNORMAL LOW (ref >60)
Glucose, Bld: 168 mg/dL — ABNORMAL HIGH (ref 70–99)
Potassium: 3.7 mmol/L (ref 3.5–5.1)
Sodium: 141 mmol/L (ref 135–145)
Total Bilirubin: 0.7 mg/dL (ref 0.3–1.2)
Total Protein: 7.5 g/dL (ref 6.5–8.1)

## 2020-10-24 LAB — DIFFERENTIAL
Abs Immature Granulocytes: 0.02 10*3/uL (ref 0.00–0.07)
Basophils Absolute: 0 10*3/uL (ref 0.0–0.1)
Basophils Relative: 0 %
Eosinophils Absolute: 0.1 10*3/uL (ref 0.0–0.5)
Eosinophils Relative: 1 %
Immature Granulocytes: 0 %
Lymphocytes Relative: 7 %
Lymphs Abs: 0.5 10*3/uL — ABNORMAL LOW (ref 0.7–4.0)
Monocytes Absolute: 0.6 10*3/uL (ref 0.1–1.0)
Monocytes Relative: 8 %
Neutro Abs: 6.2 10*3/uL (ref 1.7–7.7)
Neutrophils Relative %: 84 %

## 2020-10-24 LAB — CBC
HCT: 35.2 % — ABNORMAL LOW (ref 39.0–52.0)
Hemoglobin: 10.8 g/dL — ABNORMAL LOW (ref 13.0–17.0)
MCH: 29.1 pg (ref 26.0–34.0)
MCHC: 30.7 g/dL (ref 30.0–36.0)
MCV: 94.9 fL (ref 80.0–100.0)
Platelets: 232 10*3/uL (ref 150–400)
RBC: 3.71 MIL/uL — ABNORMAL LOW (ref 4.22–5.81)
RDW: 14.6 % (ref 11.5–15.5)
WBC: 7.5 10*3/uL (ref 4.0–10.5)
nRBC: 0 % (ref 0.0–0.2)

## 2020-10-24 LAB — PROTIME-INR
INR: 1.1 (ref 0.8–1.2)
Prothrombin Time: 13.8 seconds (ref 11.4–15.2)

## 2020-10-24 LAB — APTT: aPTT: 40 seconds — ABNORMAL HIGH (ref 24–36)

## 2020-10-24 MED ORDER — SODIUM CHLORIDE 0.9% FLUSH: 3.0000 mL | Freq: Once | INTRAVENOUS | Status: DC

## 2020-10-24 NOTE — ED Notes (Signed)
Pt left prior to e-signature being able to be obtained.

## 2020-10-24 NOTE — ED Triage Notes (Signed)
Pt comes in pov with right arm numbness/weakness. Pt essentially unable to use that arm. LKW last night at about 9pm. Woke up this morning unable to use that arm. Also c/o right hip pain last night but has resolved.   While in triage, pt seen using right arm to pick up other leg.

## 2020-10-24 NOTE — ED Notes (Signed)
Pt's family approached the [facility] desk to inform us that she is taking her father home.  Risks discussed at length with family of the patient (POA).  Family still refusing to stay and informed RN that they will go to the [place] tomorrow.

## 2022-12-19 DEATH — deceased

## 2022-12-29 IMAGING — CT CT HEAD W/O CM
4 series · 16 of 47 positions shown, 18 images · non-contrast
Comparison: None.

CLINICAL DATA: Acute neurological deficit.

EXAM:
CT HEAD WITHOUT CONTRAST
TECHNIQUE: Contiguous axial images were obtained from the base of the skull
through the vertex without intravenous contrast.

[Series 2: head bone · axial · 0.44mm/px · z∈[-109,-81]mm · 3 of 70 slices shown]
[im 7/70  bone]
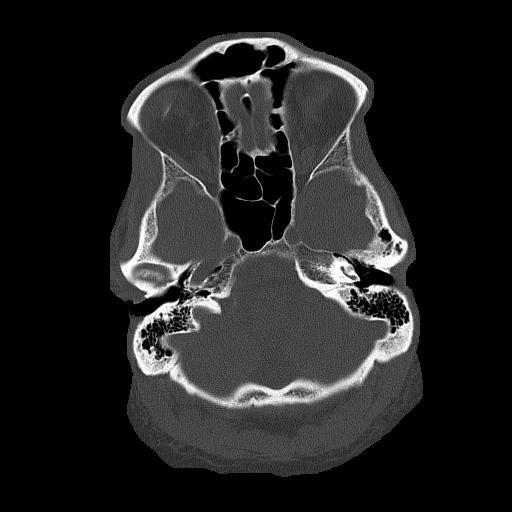
[im 14/70  bone]
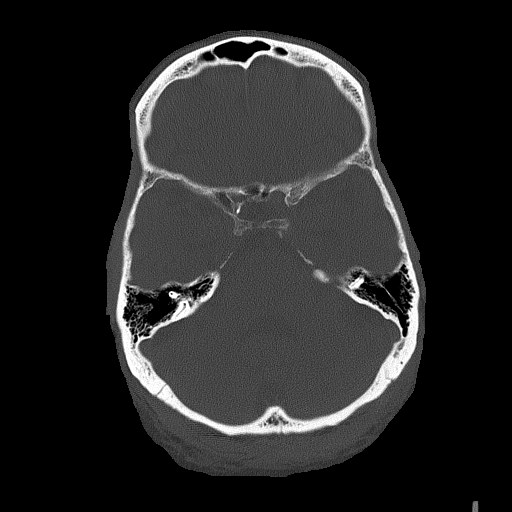
[im 21/70  bone]
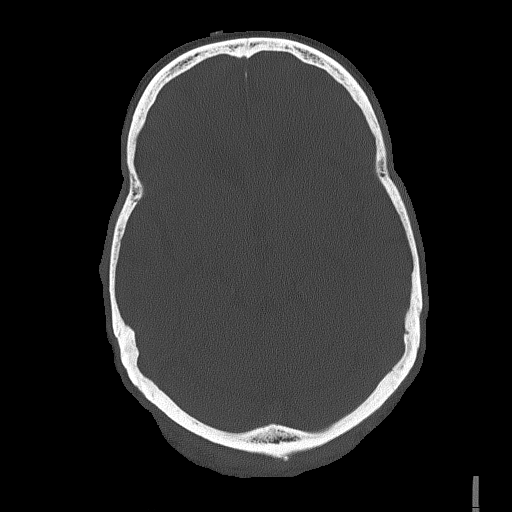

[Series 3: head wo · axial · 0.44mm/px · z∈[-106,-6]mm · 7 of 28 slices shown, 9 images]
[im 4/28  brain]
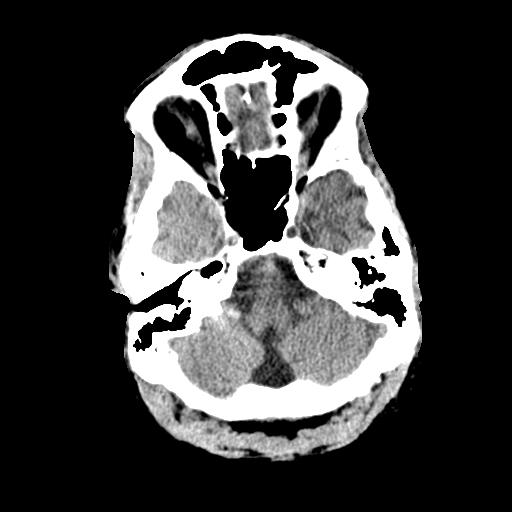
[im 4/28  bone]
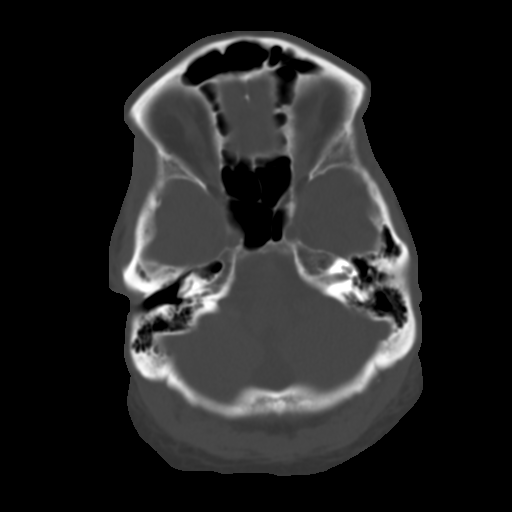
[im 7/28  brain]
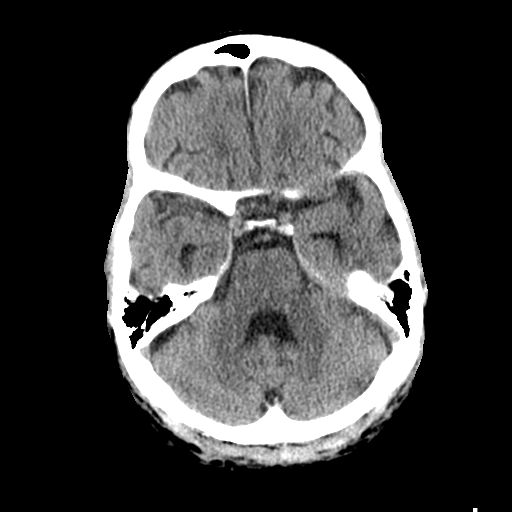
[im 11/28  brain]
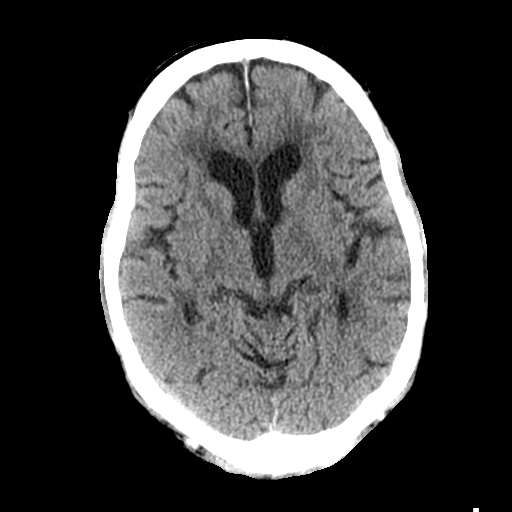
[im 14/28  brain]
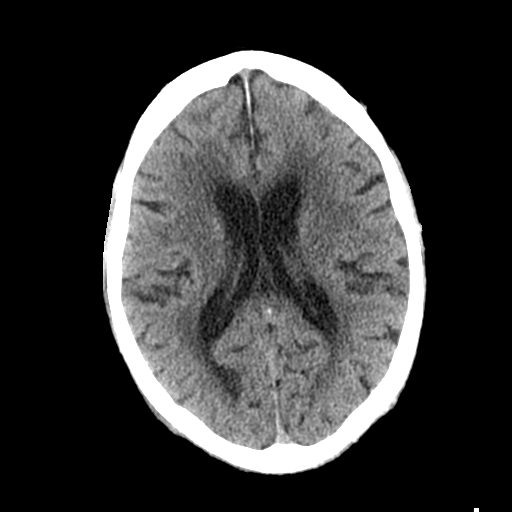
[im 17/28  brain]
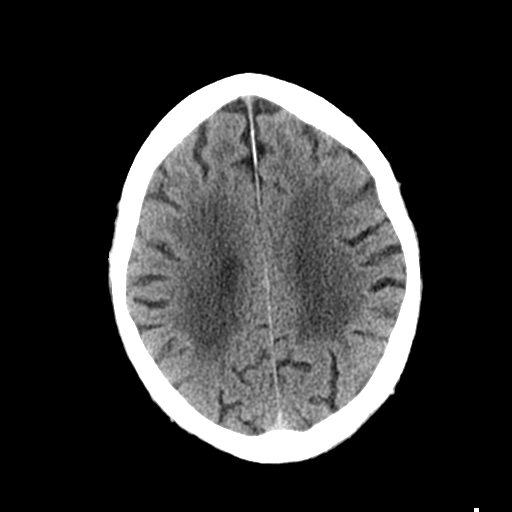
[im 17/28  bone]
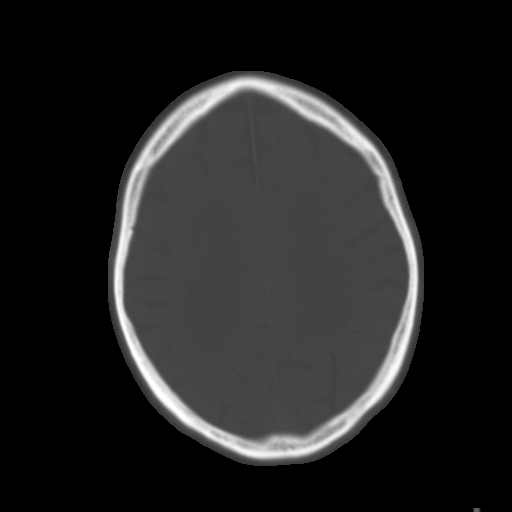
[im 21/28  brain]
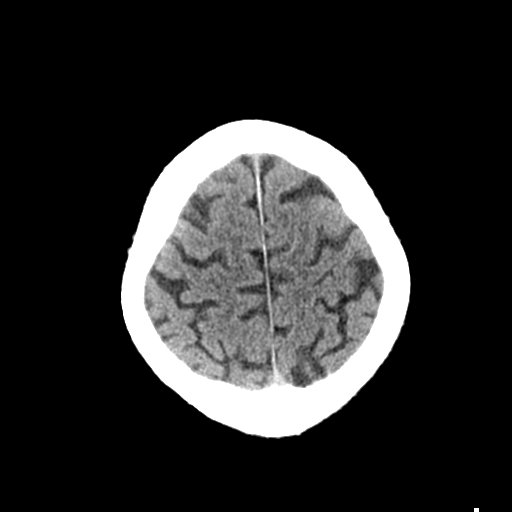
[im 24/28  brain]
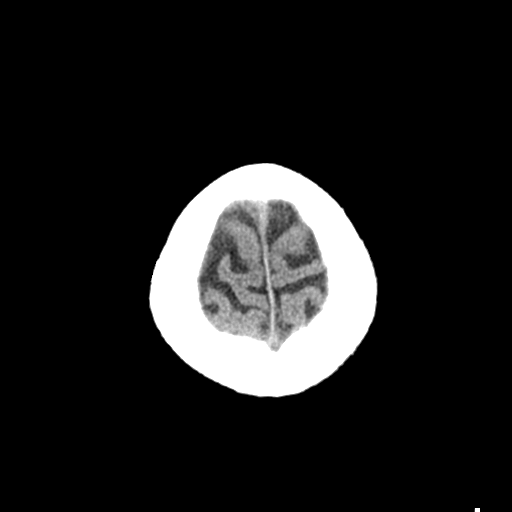

[Series 4: coronal soft tissue · coronal · 0.32mm/px · 3 of 71 slices shown]
[im 24/71  brain]
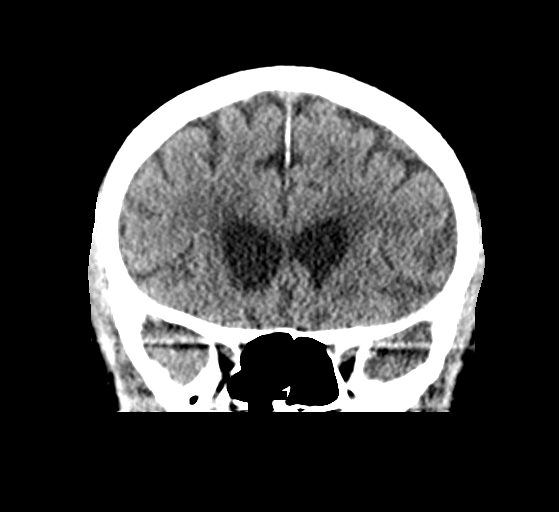
[im 32/71  brain]
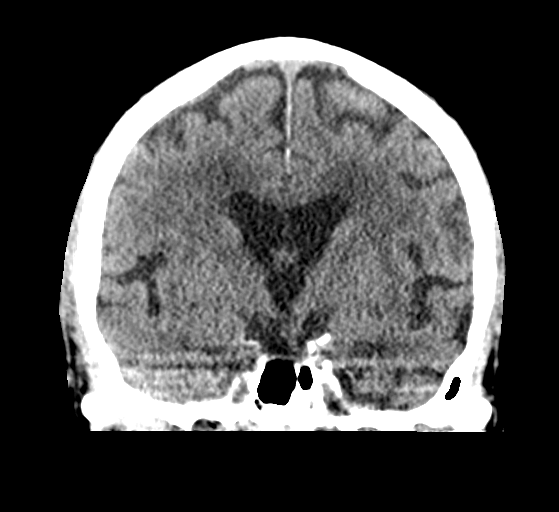
[im 39/71  brain]
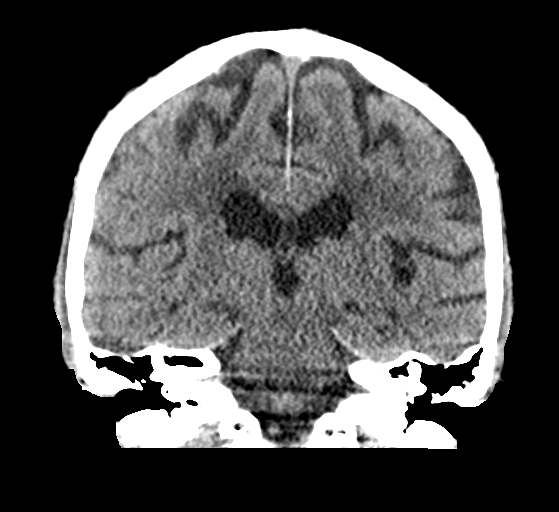

[Series 5: sagittal soft tissue · sagittal · 0.32mm/px · 3 of 56 slices shown]
[im 19/56  brain]
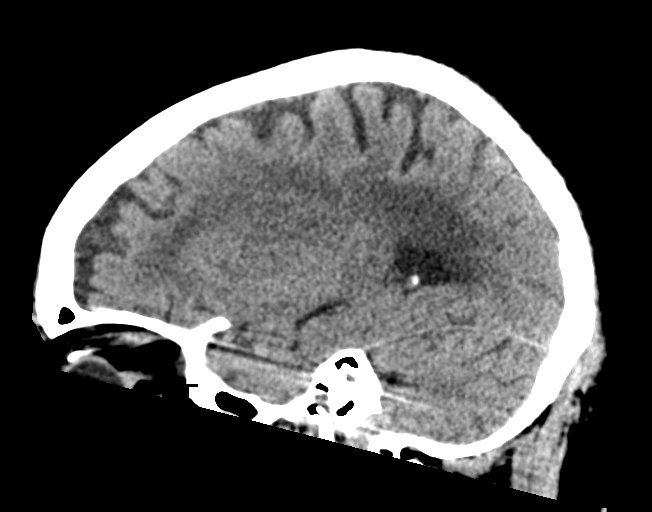
[im 28/56  brain]
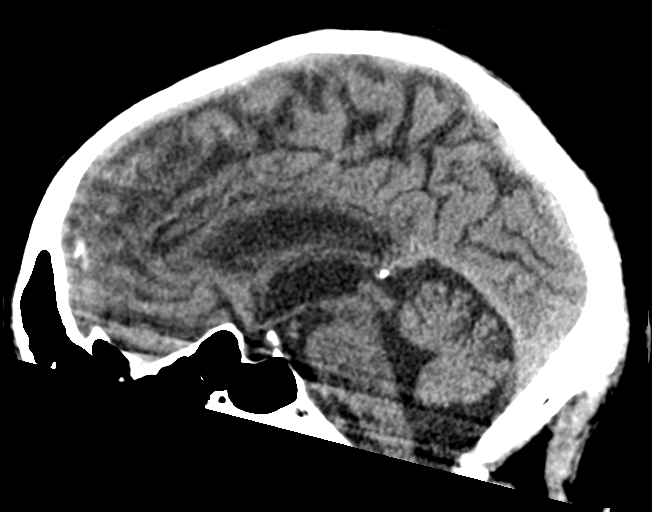
[im 37/56  brain]
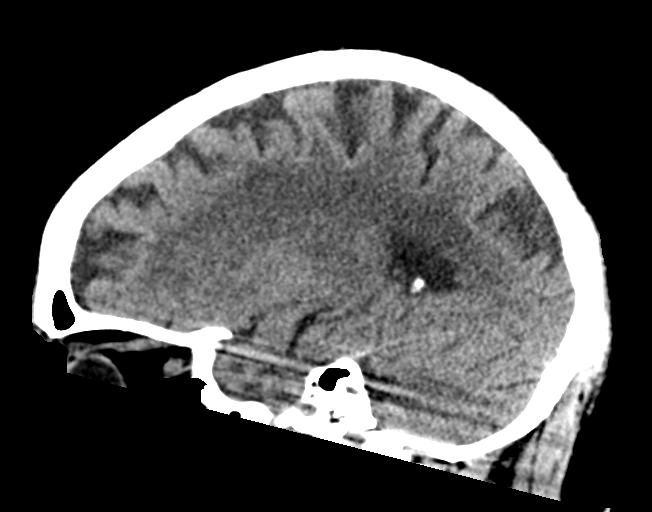

[16 of 47 positions shown; findings below may reference images not displayed]

FINDINGS: Brain: No evidence of acute infarction, hemorrhage, extra-axial
collection, ventriculomegaly, or mass effect. Generalized cerebral
atrophy. Periventricular white matter low attenuation likely
secondary to microangiopathy.

Vascular: Cerebrovascular atherosclerotic calcifications are noted.

Skull: Negative for fracture or focal lesion.

Sinuses/Orbits: Visualized portions of the orbits are unremarkable.
Visualized portions of the paranasal sinuses are unremarkable.
Visualized portions of the mastoid air cells are unremarkable.

Other: None.
IMPRESSION: 1. No acute intracranial pathology.
2. Chronic microvascular disease and cerebral atrophy.
